# Patient Record
Sex: Female | Born: 2000 | Race: White | Hispanic: No | Marital: Single | Smoking: Never smoker
Health system: Southern US, Community
[De-identification: ages and names within clinical notes are randomized; demographics above are authoritative.]

## PROBLEM LIST (undated history)

## (undated) DIAGNOSIS — R519 Headache, unspecified: Secondary | ICD-10-CM

## (undated) DIAGNOSIS — Z516 Encounter for desensitization to allergens: Secondary | ICD-10-CM

## (undated) DIAGNOSIS — Z973 Presence of spectacles and contact lenses: Secondary | ICD-10-CM

## (undated) DIAGNOSIS — J45909 Unspecified asthma, uncomplicated: Secondary | ICD-10-CM

## (undated) DIAGNOSIS — Z9289 Personal history of other medical treatment: Secondary | ICD-10-CM

## (undated) DIAGNOSIS — H5213 Myopia, bilateral: Secondary | ICD-10-CM

## (undated) DIAGNOSIS — F909 Attention-deficit hyperactivity disorder, unspecified type: Secondary | ICD-10-CM

## (undated) DIAGNOSIS — L709 Acne, unspecified: Secondary | ICD-10-CM

## (undated) DIAGNOSIS — F419 Anxiety disorder, unspecified: Secondary | ICD-10-CM

## (undated) DIAGNOSIS — Q169 Congenital malformation of ear causing impairment of hearing, unspecified: Secondary | ICD-10-CM

## (undated) HISTORY — DX: Personal history of other medical treatment: Z92.89

## (undated) HISTORY — DX: Headache, unspecified: R51.9

## (undated) HISTORY — DX: Acne, unspecified: L70.9

## (undated) HISTORY — DX: Unspecified asthma, uncomplicated: J45.909

## (undated) HISTORY — DX: Presence of spectacles and contact lenses: Z97.3

## (undated) HISTORY — DX: Anxiety disorder, unspecified: F41.9

## (undated) HISTORY — DX: Encounter for desensitization to allergens: Z51.6

## (undated) HISTORY — DX: Myopia, bilateral: H52.13

## (undated) HISTORY — DX: Congenital malformation of ear causing impairment of hearing, unspecified: Q16.9

## (undated) HISTORY — DX: Attention-deficit hyperactivity disorder, unspecified type: F90.9

---
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
## 2021-01-11 ENCOUNTER — Other Ambulatory Visit: Payer: Self-pay

## 2021-01-11 ENCOUNTER — Ambulatory Visit (INDEPENDENT_AMBULATORY_CARE_PROVIDER_SITE_OTHER): Payer: BC Managed Care – PPO | Admitting: Otolaryngology

## 2021-01-11 DIAGNOSIS — H6121 Impacted cerumen, right ear: Secondary | ICD-10-CM

## 2021-01-11 DIAGNOSIS — J310 Chronic rhinitis: Secondary | ICD-10-CM

## 2021-01-11 NOTE — Progress Notes (Signed)
HPI: Debra Osborne is a 20 y.o. female who presents is referred by her PCP for evaluation of blockage of her ears and hearing.  She has history of allergies and gets allergy shots.  She has small ear canals bilaterally and has had wax cleaned from ears previously.  She complains of some blockage of her ears and presents here to have cerumen removed..  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 Social History   Socioeconomic History   Marital status: Single    Spouse name: Not on file   Number of children: Not on file   Years of education: Not on file   Highest education level: 10th grade  Occupational History   Occupation: Consulting civil engineer  Tobacco Use   Smoking status: Never   Smokeless tobacco: Never  Vaping Use   Vaping Use: Never used  Substance and Sexual Activity   Alcohol use: Never   Drug use: Never   Sexual activity: Not on file  Other Topics Concern   Not on file  Social History Narrative   Junior at [employer] in IP program with 7 classes currently on site 2 days weekly after otherwise over a year of online virtual Zoom school has seen grades dropped from all A's to some  F's.  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  She is only child, and mother is treated in this office for bipolar disorder considering patient high functioning likely to recover with IEP.  However they have testing starting and scheduled to be completed in mid April  by Leanna Sato, Psy.D. Patient feels though mother doubts that she needs immediate help with focus and concentration overcome zoning out being lost in overthinking to recover her academic status for this year as she returns toward onsite schooling and resumes full schedule of other activities, having anxiety and at times moodiness particularly with menses consequences for her ADHD.   Social Determinants of Health   Financial Resource Strain: Not on file  Food Insecurity: Not on file  Transportation Needs: Not on file  Physical Activity: Not on file  Stress: Not on file  Social Connections: Not on file   Family History  Problem Relation Age of Onset   Bipolar disorder Mother    Migraines Father    Bipolar disorder Maternal Grandfather    OCD Maternal Grandfather    Diabetes Paternal Grandfather    Migraines Paternal Grandfather    ADD / ADHD Maternal Uncle    OCD Paternal Uncle    Alcohol abuse Other    Diabetes Other    Heart disease Other    Stroke Other    Allergies  Allergen Reactions   Latex     Per pt report hands turn bright red and itch   Cephalosporins Hives and Rash   Prior to Admission medications   Medication Sig Start Date End Date Taking? Authorizing Provider  albuterol (VENTOLIN HFA) 108 (90 Base) MCG/ACT inhaler Inhale 1-2 puffs into the lungs every 4 (four) hours as needed. 12/22/19   [provider]  amphetamine-dextroamphetamine (ADDERALL XR) 30 MG 24 hr capsule Take 1 capsule (30 mg total) by mouth daily. 01/30/21   Cherie Ouch, PA-C  amphetamine-dextroamphetamine (ADDERALL XR) 30 MG 24 hr capsule Take 1 capsule (30 mg total) by mouth daily. 12/31/20   Cherie Ouch, PA-C  amphetamine-dextroamphetamine (ADDERALL XR) 30 MG 24 hr capsule Take 1 capsule (30 mg total) by mouth daily. 11/30/20   Melony Overly T, PA-C  budesonide-formoterol (SYMBICORT) 80-4.5 MCG/ACT inhaler 2 puffs    [provider]  clindamycin (CLINDAGEL) 1 % gel Apply 1  application topically daily. 12/23/19   [provider]  EPINEPHrine 0.3 mg/0.3 mL IJ SOAJ injection See admin instructions.    [provider]  hydrOXYzine (ATARAX/VISTARIL) 10 MG tablet Take 1-2 tablets (10-20 mg total) by mouth every 4 (four) hours as needed for anxiety. 09/15/20   Melony Overly T, PA-C  levocetirizine (XYZAL) 5 MG tablet Take 5 mg by mouth daily. 05/16/20   [provider]  norgestimate-ethinyl estradiol (ORTHO-CYCLEN) 0.25-35 MG-MCG tablet  01/11/20   [provider]  tretinoin (RETIN-A) 0.1 % cream Apply 1 application topically at bedtime. 05/18/20   [provider]     Positive ROS: Otherwise negative  All other systems have been reviewed and were otherwise negative with the exception of those mentioned in the HPI and as above.  Physical Exam: Constitutional: Alert, well-appearing, no acute distress Ears: External ears without lesions or tenderness. Ear canals are small bilaterally.  She had little bit of wax in the right ear canal that was cleaned with a curette as well as hydroperoxide and suction.  The right TM was clear.  The left ear canal had minimal cerumen that was nonobstructing and the left TM was clear.  On hearing screening with the tuning forks AC was equivocal to [facility] bilaterally but subjectively she seemed to hear well in both ears with a 1024 tuning fork. Nasal: External nose without lesions. Septum with minimal deformity and mild rhinitis.  Both middle meatus regions were clear with no signs of infection.. Oral: Lips and gums without lesions. Tongue and palate mucosa without lesions. Posterior oropharynx clear. Neck: No palpable adenopathy or masses Respiratory: Breathing comfortably  Skin: No facial/neck lesions or rash noted.  Cerumen impaction removal  Date/Time: 01/11/2021 2:33 PM Performed by: Drema Halon, MD Authorized by: Drema Halon, MD   Consent:    Consent obtained:  Verbal    Consent given by:  Patient   Risks discussed:  Pain and bleeding Procedure details:    Location:  R ear   Procedure type: curette and suction   Post-procedure details:    Inspection:  TM intact and canal normal   Hearing quality:  Improved   Procedure completion:  Tolerated well, no immediate complications Comments:     Patient with small ear canals.  She has minimal wax buildup on the right side that was cleaned with curette and suction.  The left ear canal left TM are clear.  After cleaning the wax the right TM was clear.  Assessment: Minimal wax buildup in her ears.  Right ear canal was cleaned in the office today and left ear canal did not need cleaning. I suspect allergies are contributing some to the congestion in her ears.  Plan: Recommended taking her allergy medication and use of  Nasacort or Flonase 2 sprays each nostril at night as this should help with congestion in the ears as well as congestion in the sinuses. She will follow-up as needed   Narda Bonds, MD   CC:

## 2021-02-10 ENCOUNTER — Telehealth: Payer: Self-pay | Admitting: Physician Assistant

## 2021-02-26 ENCOUNTER — Other Ambulatory Visit: Payer: Self-pay | Admitting: Physician Assistant

## 2021-02-27 ENCOUNTER — Other Ambulatory Visit: Payer: Self-pay

## 2021-02-27 MED ORDER — HYDROXYZINE HCL 10 MG PO TABS: ORAL_TABLET | ORAL | 0 refills | Status: DC

## 2021-02-27 MED ORDER — AMPHETAMINE-DEXTROAMPHET ER 30 MG PO CP24: 30.0000 mg | ORAL_CAPSULE | Freq: Every day | ORAL | 0 refills | Status: DC

## 2021-02-27 NOTE — Telephone Encounter (Signed)
Nevermind. I need to send it!

## 2021-02-27 NOTE — Telephone Encounter (Signed)
Yes, please call pharmacy and ok it. Thanks

## 2021-02-27 NOTE — Telephone Encounter (Signed)
Wing called to get refill of Adderall and Hydroxyzine to  [facility] [place], [place] - [address]  [address], [facility] [place] [address]  Phone:  [phone]  Fax:  [phone]   She has confirmed Adderall is available there.  Pharmacy closes tomorrow at 12.  She also asked if her script for the Hydroxyzine could be increased from 60 pills to 90 pills.  Next appt is 12/9

## 2021-03-16 ENCOUNTER — Encounter: Payer: Self-pay | Admitting: Physician Assistant

## 2021-03-16 ENCOUNTER — Telehealth (INDEPENDENT_AMBULATORY_CARE_PROVIDER_SITE_OTHER): Payer: BC Managed Care – PPO | Admitting: Physician Assistant

## 2021-03-16 DIAGNOSIS — F902 Attention-deficit hyperactivity disorder, combined type: Secondary | ICD-10-CM | POA: Diagnosis not present

## 2021-03-16 DIAGNOSIS — F411 Generalized anxiety disorder: Secondary | ICD-10-CM | POA: Diagnosis not present

## 2021-03-16 MED ORDER — AMPHETAMINE-DEXTROAMPHET ER 30 MG PO CP24: 30.0000 mg | ORAL_CAPSULE | Freq: Every day | ORAL | 0 refills | Status: DC

## 2021-03-16 MED ORDER — HYDROXYZINE HCL 25 MG PO TABS: 25.0000 mg | ORAL_TABLET | Freq: Three times a day (TID) | ORAL | 1 refills | Status: DC | PRN

## 2021-03-16 MED ORDER — BUSPIRONE HCL 15 MG PO TABS: ORAL_TABLET | ORAL | 1 refills | Status: DC

## 2021-03-16 NOTE — Progress Notes (Signed)
[facility]  Patient ID: Debra Osborne,  MRN: [id number]  PCP: Debra Housekeeper, MD  Date of Evaluation: 03/16/2021  time spent:30 minutes  Chief Complaint:  Chief Complaint   Anxiety; ADD; Follow-up    Virtual Visit via Telehealth  I connected with patient by a video enabled telemedicine application with their informed consent, and verified patient privacy and that I am speaking with the correct person using two identifiers.  I am private, in my office and the patient is at in her dorm.  I discussed the limitations, risks, security and privacy concerns of performing an evaluation and management service by telephone video and the availability of in person appointments. I also discussed with the patient that there may be a patient responsible charge related to this service. The patient expressed understanding and agreed to proceed.   I discussed the assessment and treatment plan with the patient. The patient was provided an opportunity to ask questions and all were answered. The patient agreed with the plan and demonstrated an understanding of the instructions.   The patient was advised to call back or seek an in-person evaluation if the symptoms worsen or if the condition fails to improve as anticipated.  I provided 30 minutes of non-face-to-face time during this encounter.    HISTORY/CURRENT STATUS: HPI For routine med check.  Very stressed.  In her first semester of college at [employer].  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  Her therapist who she sees every 2 to 4 weeks has recommended that she start an SSRI because of the anxiety.  Debra Osborne has a constant state of anxiety but on average she has a couple of panic attacks per week, sometimes no panic attacks in 1 week but sometimes as often as 4 times per week.  The hydroxyzine does help but she has to take around 40 mg for it to do anything.  She sleeps well most of the time, but  she sometimes does not have time to sleep because of her schoolwork.  States that attention is good without easy distractibility.  Able to focus on things and finish tasks to completion.   Patient denies loss of interest in usual activities and is able to enjoy things.  Denies decreased energy or motivation.  Appetite has not changed.  No extreme sadness, tearfulness, or feelings of hopelessness.  Denies suicidal or homicidal thoughts.  Denies dizziness, syncope, seizures, numbness, tingling, tremor, tics, unsteady gait, slurred speech, confusion. Denies muscle or joint pain, stiffness, or dystonia.  Individual Medical History/ Review of Systems: Changes? :No    Past medications for mental health diagnoses include: Concerta, Ritalin.   Allergies: Latex and Cephalosporins  Current Medications:  Current Outpatient Medications:    albuterol (VENTOLIN HFA) 108 (90 Base) MCG/ACT inhaler, Inhale 1-2 puffs into the lungs every 4 (four) hours as needed., Disp: , Rfl:    budesonide-formoterol (SYMBICORT) 80-4.5 MCG/ACT inhaler, 2 puffs, Disp: , Rfl:    busPIRone (BUSPAR) 15 MG tablet, 1/3 pill twice daily for 1 week, 2/3 pill twice daily for 1 week, and then 1 pill p.o. twice daily., Disp: 60 tablet, Rfl: 1   clindamycin (CLINDAGEL) 1 % gel, Apply 1 application topically daily., Disp: , Rfl:    EPINEPHrine 0.3 mg/0.3 mL IJ SOAJ injection, See admin instructions., Disp: , Rfl:    hydrOXYzine (ATARAX) 25 MG tablet, Take 1-2 tablets (25-50 mg total) by mouth every 8 (eight) hours as needed.,  Disp: 90 tablet, Rfl: 1   levocetirizine (XYZAL) 5 MG tablet, Take 5 mg by mouth daily., Disp: , Rfl:    norgestimate-ethinyl estradiol (ORTHO-CYCLEN) 0.25-35 MG-MCG tablet, , Disp: , Rfl:    tretinoin (RETIN-A) 0.1 % cream, Apply 1 application topically at bedtime., Disp: , Rfl:    [START ON 05/26/2021] amphetamine-dextroamphetamine (ADDERALL XR) 30 MG 24 hr capsule, Take 1 capsule (30 mg total) by mouth daily.,  Disp: 31 capsule, Rfl: 0   [START ON 04/27/2021] amphetamine-dextroamphetamine (ADDERALL XR) 30 MG 24 hr capsule, Take 1 capsule (30 mg total) by mouth daily., Disp: 31 capsule, Rfl: 0   [START ON 03/28/2021] amphetamine-dextroamphetamine (ADDERALL XR) 30 MG 24 hr capsule, Take 1 capsule (30 mg total) by mouth daily., Disp: 31 capsule, Rfl: 0 Medication Side Effects: none  Family Medical/ Social History: Changes? No  MENTAL HEALTH EXAM:  There were no vitals taken for this visit.There is no height or weight on file to calculate BMI.  General Appearance: Casual, Neat and Well Groomed  Eye Contact:  Good  Speech:  Clear and Coherent and Normal Rate  Volume:  Normal  Mood:  Anxious  Affect:  Anxious  Thought Process:  Goal Directed and Descriptions of Associations: Intact  Orientation:  Full (Time, Place, and Person)  Thought Content: Logical   Suicidal Thoughts:  No  Homicidal Thoughts:  No  Memory:  WNL  Judgement:  Good  Insight:  Good  Psychomotor Activity:  Normal  Concentration:  Concentration: Good and Attention Span: Good  Recall:  Good  Fund of Knowledge: Good  Language: Good  Assets:  Desire for Improvement  ADL's:  Intact  Cognition: WNL  Prognosis:  Good    DIAGNOSES:    ICD-10-CM   1. Generalized anxiety disorder  F41.1     2. Attention deficit hyperactivity disorder (ADHD), combined type, moderate  F90.2         Receiving Psychotherapy: Yes    RECOMMENDATIONS:  PDMP reviewed.  Last Adderall filled 03/29/2021. I provided 30 minutes of non-face-to-face time during this encounter, including time spent before and after the visit in records review, medical decision making, counseling pertinent to today's visit, and charting.  Discussed the anxiety.  SSRIs are good option but, with side effects including increased hunger, weight gain, emotional flattening, and sexual dysfunction.  I believe BuSpar would be a better choice at this point as she is not depressed  and BuSpar usually does not have as many side effects.  We did discuss the possibility of headache, nausea and will start at a lower dose and ramp up over a 3-week period to help prevent those side effects.  She prefers to try BuSpar over an SSRI. Also recommend changing the hydroxyzine dose since she is having to take around 40 mg for it to be effective. Increase hydroxyzine to 25 mg, 1-2 p.o. 3 times daily as needed anxiety.   Start BuSpar 15 mg 1/3 tablet twice daily for 1 week, then increase to 2/3 tablet twice daily for 1 week, then increase to 1 tablet twice daily for anxiety. Continue Adderall XR 30 mg, 1 p.o. every morning. Continue therapy. Return in 2 months.  Melony Overly, PA-C

## 2021-04-10 DIAGNOSIS — J301 Allergic rhinitis due to pollen: Secondary | ICD-10-CM | POA: Diagnosis not present

## 2021-04-10 DIAGNOSIS — J3081 Allergic rhinitis due to animal (cat) (dog) hair and dander: Secondary | ICD-10-CM | POA: Diagnosis not present

## 2021-04-10 DIAGNOSIS — J3089 Other allergic rhinitis: Secondary | ICD-10-CM | POA: Diagnosis not present

## 2021-04-17 DIAGNOSIS — Z62820 Parent-biological child conflict: Secondary | ICD-10-CM | POA: Diagnosis not present

## 2021-04-17 DIAGNOSIS — F411 Generalized anxiety disorder: Secondary | ICD-10-CM | POA: Diagnosis not present

## 2021-04-17 DIAGNOSIS — F908 Attention-deficit hyperactivity disorder, other type: Secondary | ICD-10-CM | POA: Diagnosis not present

## 2021-04-21 DIAGNOSIS — J069 Acute upper respiratory infection, unspecified: Secondary | ICD-10-CM | POA: Diagnosis not present

## 2021-04-30 ENCOUNTER — Encounter: Payer: Self-pay | Admitting: Physician Assistant

## 2021-04-30 ENCOUNTER — Telehealth (INDEPENDENT_AMBULATORY_CARE_PROVIDER_SITE_OTHER): Payer: BC Managed Care – PPO | Admitting: Physician Assistant

## 2021-04-30 DIAGNOSIS — F411 Generalized anxiety disorder: Secondary | ICD-10-CM

## 2021-04-30 DIAGNOSIS — G4700 Insomnia, unspecified: Secondary | ICD-10-CM

## 2021-04-30 DIAGNOSIS — F418 Other specified anxiety disorders: Secondary | ICD-10-CM

## 2021-04-30 DIAGNOSIS — F902 Attention-deficit hyperactivity disorder, combined type: Secondary | ICD-10-CM

## 2021-04-30 MED ORDER — BUSPIRONE HCL 15 MG PO TABS: 15.0000 mg | ORAL_TABLET | Freq: Three times a day (TID) | ORAL | 1 refills | Status: DC

## 2021-04-30 MED ORDER — HYDROXYZINE HCL 25 MG PO TABS: 25.0000 mg | ORAL_TABLET | Freq: Three times a day (TID) | ORAL | 5 refills | Status: DC | PRN

## 2021-04-30 NOTE — Progress Notes (Signed)
[facility]  Patient ID: Debra Osborne,  MRN: [id number]  PCP: Georgann Housekeeper, MD  Date of Evaluation: 04/30/2021  time spent:20 minutes  Chief Complaint:  Chief Complaint   Anxiety; ADHD; Follow-up    Virtual Visit via Telehealth  I connected with patient by a video enabled telemedicine application with their informed consent, and verified patient privacy and that I am speaking with the correct person using two identifiers.  I am private, in my office and the patient is at in her dorm.  I discussed the limitations, risks, security and privacy concerns of performing an evaluation and management service by telephone video and the availability of in person appointments. I also discussed with the patient that there may be a patient responsible charge related to this service. The patient expressed understanding and agreed to proceed.   I discussed the assessment and treatment plan with the patient. The patient was provided an opportunity to ask questions and all were answered. The patient agreed with the plan and demonstrated an understanding of the instructions.   The patient was advised to call back or seek an in-person evaluation if the symptoms worsen or if the condition fails to improve as anticipated.  I provided 20 minutes of non-face-to-face time during this encounter.    HISTORY/CURRENT STATUS: HPI For routine med check.  Anxiety is much better since we increased the hydroxyzine and added BuSpar.  The hydroxyzine also helps her sleep when nothing else has.  She is tolerating the BuSpar just fine but does report dizziness that happens pretty much right away after taking it (takes on an empty stomach) and usually goes away in about 15 minutes.  She states the benefits outweigh that side effect though and she does not want to change it.  She also notices it wearing off by the time she is able to take her second dose during the day and if she does not take it right away  then she will have a panic attack.  The generalized worry, ruminating thoughts, obsessions are not there anymore though.  Adderall is still working well.  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.  Grades are good.  States that attention is good without easy distractibility.  Able to focus on things and finish tasks to completion.   Patient denies loss of interest in usual activities and is able to enjoy things.  Denies decreased energy or motivation.  Appetite has not changed.  No extreme sadness, tearfulness, or feelings of hopelessness.  Denies suicidal or homicidal thoughts.  Denies dizziness, syncope, seizures, numbness, tingling, tremor, tics, unsteady gait, slurred speech, confusion. Denies muscle or joint pain, stiffness, or dystonia.  Individual Medical History/ Review of Systems: Changes? :No    Past medications for mental health diagnoses include: Concerta, Ritalin.   Allergies: Latex and Cephalosporins  Current Medications:  Current Outpatient Medications:    albuterol (VENTOLIN HFA) 108 (90 Base) MCG/ACT inhaler, Inhale 1-2 puffs into the lungs every 4 (four) hours as needed., Disp: , Rfl:    [START ON 05/26/2021] amphetamine-dextroamphetamine (ADDERALL XR) 30 MG 24 hr capsule, Take 1 capsule (30 mg total) by mouth daily., Disp: 31 capsule, Rfl: 0   amphetamine-dextroamphetamine (ADDERALL XR) 30 MG 24 hr capsule, Take 1 capsule (30 mg total) by mouth daily., Disp: 31 capsule, Rfl: 0   amphetamine-dextroamphetamine (ADDERALL XR) 30 MG 24 hr capsule, Take 1 capsule (30 mg total) by mouth daily., Disp: 31 capsule, Rfl: 0   budesonide-formoterol (  SYMBICORT) 80-4.5 MCG/ACT inhaler, 2 puffs, Disp: , Rfl:    clindamycin (CLINDAGEL) 1 % gel, Apply 1 application topically daily., Disp: , Rfl:    EPINEPHrine 0.3 mg/0.3 mL IJ SOAJ injection, See admin instructions., Disp: , Rfl:    levocetirizine (XYZAL) 5 MG tablet, Take 5 mg by mouth daily., Disp: , Rfl:     norgestimate-ethinyl estradiol (ORTHO-CYCLEN) 0.25-35 MG-MCG tablet, , Disp: , Rfl:    tretinoin (RETIN-A) 0.1 % cream, Apply 1 application topically at bedtime., Disp: , Rfl:    busPIRone (BUSPAR) 15 MG tablet, Take 1 tablet (15 mg total) by mouth 3 (three) times daily., Disp: 90 tablet, Rfl: 1   hydrOXYzine (ATARAX) 25 MG tablet, Take 1-2 tablets (25-50 mg total) by mouth every 8 (eight) hours as needed., Disp: 90 tablet, Rfl: 5 Medication Side Effects: dizziness/lightheadedness  Family Medical/ Social History: Changes? No  MENTAL HEALTH EXAM:  There were no vitals taken for this visit.There is no height or weight on file to calculate BMI.  General Appearance: Casual, Neat and Well Groomed  Eye Contact:  Good  Speech:  Clear and Coherent and Normal Rate  Volume:  Normal  Mood:  Euthymic  Affect:  Congruent  Thought Process:  Goal Directed and Descriptions of Associations: Intact  Orientation:  Full (Time, Place, and Person)  Thought Content: Logical   Suicidal Thoughts:  No  Homicidal Thoughts:  No  Memory:  WNL  Judgement:  Good  Insight:  Good  Psychomotor Activity:  Normal  Concentration:  Concentration: Good and Attention Span: Good  Recall:  Good  Fund of Knowledge: Good  Language: Good  Assets:  Desire for Improvement  ADL's:  Intact  Cognition: WNL  Prognosis:  Good    DIAGNOSES:    ICD-10-CM   1. Generalized anxiety disorder  F41.1     2. Attention deficit hyperactivity disorder (ADHD), combined type, moderate  F90.2     3. Situational anxiety  F41.8     4. Insomnia, unspecified type  G47.00          Receiving Psychotherapy: Yes    RECOMMENDATIONS:  PDMP reviewed.  Last Adderall filled 03/31/2021. I provided 20 minutes of non-face-to-face time during this encounter, including time spent before and after the visit in records review, medical decision making, counseling pertinent to today's visit, and charting.  We discussed the dizziness.  Recommend  she take the BuSpar with food, even if it is just a peanut butter cracker.  That may help prevent the dizziness. Since it seems that the BuSpar is wearing off by taking it twice daily, I recommend increasing to 3 times daily.  She will set an alarm so she will not forget the midday dose.  On the prescription the directions will be to take 1 p.o. 3 times daily, but I do recommend that she take only 5 mg or 10 mg midday to begin with, if that dose is effective then do not increase it.  If not increased to 15 mg midday as well.  Again, with the dizziness, she might not have that side effect at a lower dose.  She understands. Continue hydroxyzine 25 mg, 1-2 p.o. 3 times daily as needed anxiety or sleep. Increase BuSpar 15 mg to 1 p.o. 3 times daily.   Continue Adderall XR 30 mg, 1 p.o. every morning. Continue therapy. Return in 6 weeks.  Melony Overly, PA-C

## 2021-05-01 DIAGNOSIS — F908 Attention-deficit hyperactivity disorder, other type: Secondary | ICD-10-CM | POA: Diagnosis not present

## 2021-05-01 DIAGNOSIS — Z62820 Parent-biological child conflict: Secondary | ICD-10-CM | POA: Diagnosis not present

## 2021-05-01 DIAGNOSIS — F411 Generalized anxiety disorder: Secondary | ICD-10-CM | POA: Diagnosis not present

## 2021-05-03 DIAGNOSIS — Z516 Encounter for desensitization to allergens: Secondary | ICD-10-CM | POA: Diagnosis not present

## 2021-05-10 DIAGNOSIS — Z516 Encounter for desensitization to allergens: Secondary | ICD-10-CM | POA: Diagnosis not present

## 2021-05-15 DIAGNOSIS — F908 Attention-deficit hyperactivity disorder, other type: Secondary | ICD-10-CM | POA: Diagnosis not present

## 2021-05-15 DIAGNOSIS — F411 Generalized anxiety disorder: Secondary | ICD-10-CM | POA: Diagnosis not present

## 2021-05-15 DIAGNOSIS — Z62820 Parent-biological child conflict: Secondary | ICD-10-CM | POA: Diagnosis not present

## 2021-05-16 DIAGNOSIS — Z516 Encounter for desensitization to allergens: Secondary | ICD-10-CM | POA: Diagnosis not present

## 2021-05-23 DIAGNOSIS — Z516 Encounter for desensitization to allergens: Secondary | ICD-10-CM | POA: Diagnosis not present

## 2021-05-28 DIAGNOSIS — F908 Attention-deficit hyperactivity disorder, other type: Secondary | ICD-10-CM | POA: Diagnosis not present

## 2021-05-28 DIAGNOSIS — F410 Panic disorder [episodic paroxysmal anxiety] without agoraphobia: Secondary | ICD-10-CM | POA: Diagnosis not present

## 2021-05-28 DIAGNOSIS — F439 Reaction to severe stress, unspecified: Secondary | ICD-10-CM | POA: Diagnosis not present

## 2021-05-31 DIAGNOSIS — Z516 Encounter for desensitization to allergens: Secondary | ICD-10-CM | POA: Diagnosis not present

## 2021-06-07 DIAGNOSIS — Z516 Encounter for desensitization to allergens: Secondary | ICD-10-CM | POA: Diagnosis not present

## 2021-06-11 ENCOUNTER — Encounter: Payer: Self-pay | Admitting: Physician Assistant

## 2021-06-11 ENCOUNTER — Telehealth (INDEPENDENT_AMBULATORY_CARE_PROVIDER_SITE_OTHER): Payer: BC Managed Care – PPO | Admitting: Physician Assistant

## 2021-06-11 DIAGNOSIS — F411 Generalized anxiety disorder: Secondary | ICD-10-CM

## 2021-06-11 DIAGNOSIS — G4700 Insomnia, unspecified: Secondary | ICD-10-CM | POA: Diagnosis not present

## 2021-06-11 DIAGNOSIS — F902 Attention-deficit hyperactivity disorder, combined type: Secondary | ICD-10-CM | POA: Diagnosis not present

## 2021-06-11 MED ORDER — AMPHETAMINE-DEXTROAMPHET ER 30 MG PO CP24: 30.0000 mg | ORAL_CAPSULE | Freq: Every day | ORAL | 0 refills | Status: DC

## 2021-06-11 MED ORDER — BUSPIRONE HCL 15 MG PO TABS: 15.0000 mg | ORAL_TABLET | Freq: Three times a day (TID) | ORAL | 5 refills | Status: DC

## 2021-06-11 MED ORDER — AMPHETAMINE-DEXTROAMPHETAMINE 20 MG PO TABS: 20.0000 mg | ORAL_TABLET | Freq: Every day | ORAL | 0 refills | Status: DC

## 2021-06-11 NOTE — Progress Notes (Signed)
[facility] ? ?Patient ID: Debra Osborne,  ?MRN: [id number] ? ?PCP: Wenda Low, MD ? ?Date of Evaluation: 06/11/2021  ?time spent:20 minutes ? ?Chief Complaint:  ?Chief Complaint   ?Anxiety ?  ? ? ?Virtual Visit via Telehealth ? ?I connected with patient by a video enabled telemedicine application with their informed consent, and verified patient privacy and that I am speaking with the correct person using two identifiers.  I am private, in my office and the patient is in her dorm. ? ?I discussed the limitations, risks, security and privacy concerns of performing an evaluation and management service by video and the availability of in person appointments. I also discussed with the patient that there may be a patient responsible charge related to this service. The patient expressed understanding and agreed to proceed. ?  ?I discussed the assessment and treatment plan with the patient. The patient was provided an opportunity to ask questions and all were answered. The patient agreed with the plan and demonstrated an understanding of the instructions. ?  ?The patient was advised to call back or seek an in-person evaluation if the symptoms worsen or if the condition fails to improve as anticipated. ? ?I provided 20 minutes of non-face-to-face time during this encounter. ? ? ? ?HISTORY/CURRENT STATUS: ?HPI For routine med check. ? ?Debra Osborne is doing very well.  We increased the BuSpar about 6 weeks ago and she has responded well to it.  She does not need the entire pill in the middle of the day dose so is only taking 7.5 mg at that point.  She is not having dizziness like she did before, it does still occur after the evening dose but states it is manageable and not bothersome.  The benefits outweigh that.  Not having panic attacks, still gets a little anxious in certain situations but overall is much much better. ? ?She is able to focus and get things done in a timely manner, at least most of the time.  In  the past she had taken an Adderall 20 mg pill in the afternoon just as needed and asks if that is a possibility to add back in.  She has not had any trouble from the [employer] of Adderall.  She has been getting it through her student health pharmacy. ? ?Patient denies loss of interest in usual activities and is able to enjoy things.  Denies decreased energy or motivation.  Appetite has not changed.  No calorie restricting, binging or purging, or laxative use.  No self-harm.  Personal hygiene is normal.  No extreme sadness, tearfulness, or feelings of hopelessness.  Sleeping better, the hydroxyzine helps. Denies suicidal or homicidal thoughts. ? ?Denies dizziness, syncope, seizures, numbness, tingling, tremor, tics, unsteady gait, slurred speech, confusion. Denies muscle or joint pain, stiffness, or dystonia. ? ?Individual Medical History/ Review of Systems: Changes? :No   ? ?Past medications for mental health diagnoses include: ?Concerta, Ritalin.  ? ?Allergies: Latex and Cephalosporins ? ?Current Medications:  ?Current Outpatient Medications:  ?  albuterol (VENTOLIN HFA) 108 (90 Base) MCG/ACT inhaler, Inhale 1-2 puffs into the lungs every 4 (four) hours as needed., Disp: , Rfl:  ?  amphetamine-dextroamphetamine (ADDERALL) 20 MG tablet, Take 1 tablet (20 mg total) by mouth daily in the afternoon. prn, Disp: 30 tablet, Rfl: 0 ?  budesonide-formoterol (SYMBICORT) 80-4.5 MCG/ACT inhaler, 2 puffs, Disp: , Rfl:  ?  clindamycin (CLINDAGEL) 1 % gel, Apply 1 application topically daily., Disp: , Rfl:  ?  EPINEPHrine 0.3  mg/0.3 mL IJ SOAJ injection, See admin instructions., Disp: , Rfl:  ?  hydrOXYzine (ATARAX) 25 MG tablet, Take 1-2 tablets (25-50 mg total) by mouth every 8 (eight) hours as needed., Disp: 90 tablet, Rfl: 5 ?  levocetirizine (XYZAL) 5 MG tablet, Take 5 mg by mouth daily., Disp: , Rfl:  ?  norgestimate-ethinyl estradiol (ORTHO-CYCLEN) 0.25-35 MG-MCG tablet, , Disp: , Rfl:  ?  tretinoin (RETIN-A) 0.1 %  cream, Apply 1 application topically at bedtime., Disp: , Rfl:  ?  [START ON 08/25/2021] amphetamine-dextroamphetamine (ADDERALL XR) 30 MG 24 hr capsule, Take 1 capsule (30 mg total) by mouth daily., Disp: 31 capsule, Rfl: 0 ?  [START ON 07/27/2021] amphetamine-dextroamphetamine (ADDERALL XR) 30 MG 24 hr capsule, Take 1 capsule (30 mg total) by mouth daily., Disp: 31 capsule, Rfl: 0 ?  [START ON 06/27/2021] amphetamine-dextroamphetamine (ADDERALL XR) 30 MG 24 hr capsule, Take 1 capsule (30 mg total) by mouth daily., Disp: 31 capsule, Rfl: 0 ?  busPIRone (BUSPAR) 15 MG tablet, Take 1 tablet (15 mg total) by mouth 3 (three) times daily., Disp: 90 tablet, Rfl: 5 ?Medication Side Effects: dizziness/lightheadedness ? ?Family Medical/ Social History: Changes? No ? ?MENTAL HEALTH EXAM: ? ?There were no vitals taken for this visit.There is no height or weight on file to calculate BMI.  ?General Appearance: Casual, Neat and Well Groomed  ?Eye Contact:  Good  ?Speech:  Clear and Coherent and Normal Rate  ?Volume:  Normal  ?Mood:  Euthymic  ?Affect:  Congruent  ?Thought Process:  Goal Directed and Descriptions of Associations: Circumstantial  ?Orientation:  Full (Time, Place, and Person)  ?Thought Content: Logical   ?Suicidal Thoughts:  No  ?Homicidal Thoughts:  No  ?Memory:  WNL  ?Judgement:  Good  ?Insight:  Good  ?Psychomotor Activity:  Normal  ?Concentration:  Concentration: Good and Attention Span: Good  ?Recall:  Good  ?Fund of Knowledge: Good  ?Language: Good  ?Assets:  Desire for Improvement  ?ADL's:  Intact  ?Cognition: WNL  ?Prognosis:  Good  ? ? ?DIAGNOSES:  ?  ICD-10-CM   ?1. Generalized anxiety disorder  F41.1   ?  ?2. Insomnia, unspecified type  G47.00   ?  ?3. Attention deficit hyperactivity disorder (ADHD), combined type, moderate  F90.2   ?  ? ? ?Receiving Psychotherapy: Yes  ? ? ?RECOMMENDATIONS:  ?PDMP reviewed.  Last Adderall filled 05/31/2021.   ?I provided 20 minutes of non-face-to-face time during this  encounter, including time spent before and after the visit in records review, medical decision making, counseling pertinent to today's visit, and charting.  ?I am glad to see her doing well!  No changes in meds are necessary except I will add in the Adderall IR 20 mg in the afternoon as needed. ? ?Continue hydroxyzine 25 mg, 1-2 p.o. 3 times daily as needed anxiety or sleep. ?Continue BuSpar 15 mg, 1 p.o. 3 times daily.  (She is usually only taking 1/2 pill mid day and that is effective for now.) ?Continue Adderall XR 30 mg, 1 p.o. every morning. ?Restart Adderall IR 20 mg, 1 p.o. q. afternoon as needed. ?Continue therapy. ?Return in 3 months. ? ?Donnal Moat, PA-C  ?

## 2021-06-12 DIAGNOSIS — F908 Attention-deficit hyperactivity disorder, other type: Secondary | ICD-10-CM | POA: Diagnosis not present

## 2021-06-12 DIAGNOSIS — F410 Panic disorder [episodic paroxysmal anxiety] without agoraphobia: Secondary | ICD-10-CM | POA: Diagnosis not present

## 2021-06-12 DIAGNOSIS — F439 Reaction to severe stress, unspecified: Secondary | ICD-10-CM | POA: Diagnosis not present

## 2021-06-12 DIAGNOSIS — Z516 Encounter for desensitization to allergens: Secondary | ICD-10-CM | POA: Diagnosis not present

## 2021-06-18 DIAGNOSIS — F908 Attention-deficit hyperactivity disorder, other type: Secondary | ICD-10-CM | POA: Diagnosis not present

## 2021-06-18 DIAGNOSIS — F410 Panic disorder [episodic paroxysmal anxiety] without agoraphobia: Secondary | ICD-10-CM | POA: Diagnosis not present

## 2021-06-18 DIAGNOSIS — F439 Reaction to severe stress, unspecified: Secondary | ICD-10-CM | POA: Diagnosis not present

## 2021-06-20 DIAGNOSIS — Z516 Encounter for desensitization to allergens: Secondary | ICD-10-CM | POA: Diagnosis not present

## 2021-06-25 ENCOUNTER — Telehealth: Payer: Self-pay | Admitting: Physician Assistant

## 2021-06-25 DIAGNOSIS — F439 Reaction to severe stress, unspecified: Secondary | ICD-10-CM | POA: Diagnosis not present

## 2021-06-25 DIAGNOSIS — F410 Panic disorder [episodic paroxysmal anxiety] without agoraphobia: Secondary | ICD-10-CM | POA: Diagnosis not present

## 2021-06-25 DIAGNOSIS — F908 Attention-deficit hyperactivity disorder, other type: Secondary | ICD-10-CM | POA: Diagnosis not present

## 2021-06-25 NOTE — Telephone Encounter (Signed)
Pt would like to know if [name] will prescribe something for her PTSD, Insomnia and nightmares before her appt on 3/27. ? ? ?
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
## 2021-06-25 NOTE — Telephone Encounter (Signed)
LVM for patient to [facility]. Last visit Adderall 20 mg IR was added for afternoon, ? Sleep problems.  ?

## 2021-06-25 NOTE — Telephone Encounter (Signed)
LVM to [facility] 

## 2021-06-25 NOTE — Telephone Encounter (Signed)
We've never talked about PTSD that I can tell, for now, have her take Hydroxyzine 25 mg, 2 qhs prn sleep and we'll talk about other options at her next visit. Thanks.

## 2021-06-26 NOTE — Telephone Encounter (Signed)
Called patient and recommendations were given.  ?

## 2021-06-28 DIAGNOSIS — Z516 Encounter for desensitization to allergens: Secondary | ICD-10-CM | POA: Diagnosis not present

## 2021-07-02 ENCOUNTER — Telehealth (INDEPENDENT_AMBULATORY_CARE_PROVIDER_SITE_OTHER): Payer: Self-pay | Admitting: Physician Assistant

## 2021-07-02 ENCOUNTER — Encounter: Payer: Self-pay | Admitting: Physician Assistant

## 2021-07-02 DIAGNOSIS — Z91199 Patient's noncompliance with other medical treatment and regimen due to unspecified reason: Secondary | ICD-10-CM

## 2021-07-02 NOTE — Progress Notes (Signed)
Patient was scheduled for telehealth visit.  I was unable to connect through [facility]ility, so called the mobile number listed on 3 separate times, leaving a message twice on an identified voicemail.  The last time someone answered but said I had a wrong number.  Debra Osborne did not call the office at any time to have the appt.   ?I have asked admin staff to reschedule her. ?

## 2021-07-05 ENCOUNTER — Encounter: Payer: Self-pay | Admitting: Physician Assistant

## 2021-07-05 ENCOUNTER — Telehealth (INDEPENDENT_AMBULATORY_CARE_PROVIDER_SITE_OTHER): Payer: BC Managed Care – PPO | Admitting: Physician Assistant

## 2021-07-05 DIAGNOSIS — F902 Attention-deficit hyperactivity disorder, combined type: Secondary | ICD-10-CM | POA: Diagnosis not present

## 2021-07-05 DIAGNOSIS — F411 Generalized anxiety disorder: Secondary | ICD-10-CM | POA: Diagnosis not present

## 2021-07-05 DIAGNOSIS — F4310 Post-traumatic stress disorder, unspecified: Secondary | ICD-10-CM

## 2021-07-05 DIAGNOSIS — G4700 Insomnia, unspecified: Secondary | ICD-10-CM | POA: Diagnosis not present

## 2021-07-05 MED ORDER — QUETIAPINE FUMARATE 25 MG PO TABS
25.0000 mg | ORAL_TABLET | Freq: Every day | ORAL | 1 refills | Status: DC
Start: 2021-07-05 — End: 2021-08-20
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
## 2021-07-10 DIAGNOSIS — Z516 Encounter for desensitization to allergens: Secondary | ICD-10-CM | POA: Diagnosis not present

## 2021-07-16 DIAGNOSIS — F439 Reaction to severe stress, unspecified: Secondary | ICD-10-CM | POA: Diagnosis not present

## 2021-07-16 DIAGNOSIS — F908 Attention-deficit hyperactivity disorder, other type: Secondary | ICD-10-CM | POA: Diagnosis not present

## 2021-07-16 DIAGNOSIS — F410 Panic disorder [episodic paroxysmal anxiety] without agoraphobia: Secondary | ICD-10-CM | POA: Diagnosis not present

## 2021-07-17 DIAGNOSIS — Z516 Encounter for desensitization to allergens: Secondary | ICD-10-CM | POA: Diagnosis not present

## 2021-07-26 DIAGNOSIS — Z516 Encounter for desensitization to allergens: Secondary | ICD-10-CM | POA: Diagnosis not present

## 2021-07-26 DIAGNOSIS — J301 Allergic rhinitis due to pollen: Secondary | ICD-10-CM | POA: Diagnosis not present

## 2021-07-26 DIAGNOSIS — J3081 Allergic rhinitis due to animal (cat) (dog) hair and dander: Secondary | ICD-10-CM | POA: Diagnosis not present

## 2021-07-27 DIAGNOSIS — J3089 Other allergic rhinitis: Secondary | ICD-10-CM | POA: Diagnosis not present

## 2021-07-30 DIAGNOSIS — F410 Panic disorder [episodic paroxysmal anxiety] without agoraphobia: Secondary | ICD-10-CM | POA: Diagnosis not present

## 2021-07-30 DIAGNOSIS — F439 Reaction to severe stress, unspecified: Secondary | ICD-10-CM | POA: Diagnosis not present

## 2021-07-30 DIAGNOSIS — F908 Attention-deficit hyperactivity disorder, other type: Secondary | ICD-10-CM | POA: Diagnosis not present

## 2021-08-07 DIAGNOSIS — Z516 Encounter for desensitization to allergens: Secondary | ICD-10-CM | POA: Diagnosis not present

## 2021-08-10 ENCOUNTER — Telehealth: Payer: BC Managed Care – PPO | Admitting: Physician Assistant

## 2021-08-13 DIAGNOSIS — F908 Attention-deficit hyperactivity disorder, other type: Secondary | ICD-10-CM | POA: Diagnosis not present

## 2021-08-13 DIAGNOSIS — F439 Reaction to severe stress, unspecified: Secondary | ICD-10-CM | POA: Diagnosis not present

## 2021-08-13 DIAGNOSIS — F410 Panic disorder [episodic paroxysmal anxiety] without agoraphobia: Secondary | ICD-10-CM | POA: Diagnosis not present

## 2021-08-20 ENCOUNTER — Encounter: Payer: Self-pay | Admitting: Physician Assistant

## 2021-08-20 ENCOUNTER — Telehealth (INDEPENDENT_AMBULATORY_CARE_PROVIDER_SITE_OTHER): Payer: BC Managed Care – PPO | Admitting: Physician Assistant

## 2021-08-20 DIAGNOSIS — F4310 Post-traumatic stress disorder, unspecified: Secondary | ICD-10-CM

## 2021-08-20 DIAGNOSIS — F418 Other specified anxiety disorders: Secondary | ICD-10-CM | POA: Diagnosis not present

## 2021-08-20 DIAGNOSIS — F514 Sleep terrors [night terrors]: Secondary | ICD-10-CM

## 2021-08-20 DIAGNOSIS — F411 Generalized anxiety disorder: Secondary | ICD-10-CM

## 2021-08-20 DIAGNOSIS — G4700 Insomnia, unspecified: Secondary | ICD-10-CM

## 2021-08-20 DIAGNOSIS — F902 Attention-deficit hyperactivity disorder, combined type: Secondary | ICD-10-CM

## 2021-08-20 MED ORDER — QUETIAPINE FUMARATE 200 MG PO TABS: ORAL_TABLET | ORAL | 1 refills | Status: DC

## 2021-08-20 NOTE — Progress Notes (Signed)
[facility] ? ?Patient ID: Debra Osborne,  ?MRN: [id number] ? ?PCP: Georgann Housekeeper, MD ? ?Date of Evaluation: 08/20/2021  ?time spent:30 minutes ? ?Chief Complaint:  ?Chief Complaint   ?Anxiety; Depression; Insomnia; Follow-up ?  ? ? ?Virtual Visit via Telehealth ? ?I connected with patient by a video enabled telemedicine application with their informed consent, and verified patient privacy and that I am speaking with the correct person using two identifiers.  I am private, in my office and the patient is in her dorm. ? ?I discussed the limitations, risks, security and privacy concerns of performing an evaluation and management service by video and the availability of in person appointments. I also discussed with the patient that there may be a patient responsible charge related to this service. The patient expressed understanding and agreed to proceed. ?  ?I discussed the assessment and treatment plan with the patient. The patient was provided an opportunity to ask questions and all were answered. The patient agreed with the plan and demonstrated an understanding of the instructions. ?  ?The patient was advised to call back or seek an in-person evaluation if the symptoms worsen or if the condition fails to improve as anticipated. ? ?I provided 30 minutes of non-face-to-face time during this encounter. ? ?HISTORY/CURRENT STATUS: ?HPI For routine med check. ? ?Added Seroquel 6 weeks ago for PTSD/nightmares, taking 75 mg every night and has to be very specific about the time that she takes it and must take it with the hydroxyzine in order to go to sleep and not have nightmares.  It is helpful but she would like for it to help even more.  She is not too drowsy, is able to get up in the mornings without any problems. ? 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  She and her grandmother will be going to [place] for 5 or 6 weeks this summer and she is looking forward to that.  She is living with her dad now that school is out.  That is going well. ? ?She is able to enjoy things.  Energy and motivation are good.  She does not cry easily.  No isolating.  ADLs and personal hygiene are normal.  Appetite is good and weight is stable.  No laxative use, binging or purging, or calorie restricting.  No self-harm.  No suicidal or homicidal thoughts. ? ?Patient denies increased energy with decreased need for sleep, no increased talkativeness, no racing thoughts, no impulsivity or risky behaviors, no increased spending, no increased libido, no grandiosity, no increased irritability or anger, and no hallucinations. ? ?Denies dizziness, syncope, seizures, numbness, tingling, tremor, tics, unsteady gait, slurred speech, confusion. Denies muscle or joint pain, stiffness, or dystonia. Denies unexplained weight loss, frequent infections, or sores that heal slowly.  No polyphagia, polydipsia, or polyuria. Denies visual changes or paresthesias.  ? ?Individual Medical History/ Review of Systems: Changes? :No   ? ?Past medications for mental health diagnoses include: ?Concerta, Ritalin.  ? ?Allergies: Latex and Cephalosporins ? ?Current Medications:  ?Current Outpatient Medications:  ?  albuterol (VENTOLIN HFA) 108 (90 Base) MCG/ACT inhaler, Inhale 1-2 puffs into the lungs every 4 (four) hours as needed., Disp: , Rfl:  ?  [START ON 08/25/2021] amphetamine-dextroamphetamine (ADDERALL XR) 30 MG 24 hr capsule, Take 1 capsule (30 mg total)  by mouth daily., Disp: 31 capsule, Rfl: 0 ?  amphetamine-dextroamphetamine (ADDERALL XR) 30 MG 24 hr capsule, Take 1 capsule (30 mg total) by mouth daily., Disp: 31 capsule, Rfl: 0 ?  amphetamine-dextroamphetamine (ADDERALL XR) 30 MG 24 hr capsule, Take 1 capsule (30 mg total) by mouth daily., Disp: 31 capsule, Rfl: 0 ?   amphetamine-dextroamphetamine (ADDERALL) 20 MG tablet, Take 1 tablet (20 mg total) by mouth daily in the afternoon. prn, Disp: 30 tablet, Rfl: 0 ?  budesonide-formoterol (SYMBICORT) 80-4.5 MCG/ACT inhaler, 2 puffs, Disp: , Rfl:  ?  busPIRone (BUSPAR) 15 MG tablet, Take 1 tablet (15 mg total) by mouth 3 (three) times daily., Disp: 90 tablet, Rfl: 5 ?  clindamycin (CLINDAGEL) 1 % gel, Apply 1 application topically daily., Disp: , Rfl:  ?  EPINEPHrine 0.3 mg/0.3 mL IJ SOAJ injection, See admin instructions., Disp: , Rfl:  ?  hydrOXYzine (ATARAX) 25 MG tablet, Take 1-2 tablets (25-50 mg total) by mouth every 8 (eight) hours as needed., Disp: 90 tablet, Rfl: 5 ?  levocetirizine (XYZAL) 5 MG tablet, Take 5 mg by mouth daily., Disp: , Rfl:  ?  norgestimate-ethinyl estradiol (ORTHO-CYCLEN) 0.25-35 MG-MCG tablet, , Disp: , Rfl:  ?  QUEtiapine (SEROQUEL) 200 MG tablet, 1/2 pill qhs for 1 wk, then may increase to 1 pill qhs., Disp: 30 tablet, Rfl: 1 ?  tretinoin (RETIN-A) 0.1 % cream, Apply 1 application topically at bedtime., Disp: , Rfl:  ?  UNABLE TO FIND, NaCl 500 mg daily, Disp: , Rfl:  ?Medication Side Effects: dizziness/lightheadedness ? ?Family Medical/ Social History: Changes? No ? ?MENTAL HEALTH EXAM: ? ?There were no vitals taken for this visit.There is no height or weight on file to calculate BMI.  ?General Appearance: Casual, Neat and Well Groomed  ?Eye Contact:  Good  ?Speech:  Clear and Coherent and Normal Rate  ?Volume:  Normal  ?Mood:  Euthymic  ?Affect:  Congruent  ?Thought Process:  Goal Directed and Descriptions of Associations: Circumstantial  ?Orientation:  Full (Time, Place, and Person)  ?Thought Content: Logical   ?Suicidal Thoughts:  No  ?Homicidal Thoughts:  No  ?Memory:  WNL  ?Judgement:  Good  ?Insight:  Good  ?Psychomotor Activity:  Normal  ?Concentration:  Concentration: Good and Attention Span: Good  ?Recall:  Good  ?Fund of Knowledge: Good  ?Language: Good  ?Assets:  Desire for  Improvement ?Financial Resources/Insurance ?Housing ?Transportation  ?ADL's:  Intact  ?Cognition: WNL  ?Prognosis:  Good  ? ? ?DIAGNOSES:  ?  ICD-10-CM   ?1. PTSD (post-traumatic stress disorder)  F43.10   ?  ?2. Attention deficit hyperactivity disorder (ADHD), combined type, moderate  F90.2   ?  ?3. Generalized anxiety disorder  F41.1   ?  ?4. Situational anxiety  F41.8   ?  ?5. Insomnia, unspecified type  G47.00   ?  ?6. Night terrors  F51.4   ?  ? ?Receiving Psychotherapy: Yes    in both DBT and talk therapy ? ? ?RECOMMENDATIONS:  ?PDMP reviewed.  Last Adderall XR filled 08/01/2021.  Last IR was filled 06/11/2021. ?I provided 30 minutes of non-face-to-face time during this encounter, including time spent before and after the visit in records review, medical decision making, counseling pertinent to today's visit, and charting.  ?We discussed her symptoms.  She has responded to the Seroquel somewhat but not enough.  I recommend increasing the dose.  She would like to do that.  When I know for sure she is going to stay  on this I will order lipids, BMP, and hemoglobin A1c. ? ?Continue Adderall XR 30 mg, 1 p.o. every morning. ?Continue Adderall IR 20 mg, 1 p.o. q. afternoon as needed. ?Continue hydroxyzine 25 mg, 1-2 p.o. 3 times daily as needed anxiety or sleep. ?Continue BuSpar 15 mg, 1 p.o. 3 times daily.  (She is usually only taking 1/2 pill mid day and that is effective for now.) ?Increase Seroquel to 200 mg, 1/2 pill p.o. nightly for 1 week and then may increase to 1 pill nightly. ?Continue therapy. ?Return in 4 weeks. ? ?Melony Overly, PA-C  ?

## 2021-08-23 ENCOUNTER — Telehealth: Payer: Self-pay | Admitting: Physician Assistant

## 2021-08-23 NOTE — Telephone Encounter (Signed)
Pt called reporting increase in Seroquel dose is causing tightness in chest. Not helping with nightmares. Contact pt @ [phone] follow up apt 6/6. [facility].

## 2021-08-23 NOTE — Telephone Encounter (Signed)
Please see if [name] has an earlier availability and RS appt with patient.  She needs to be seen in the office.

## 2021-08-23 NOTE — Telephone Encounter (Signed)
If SOB or severe pain, go to ER. O/w Go back down to Seroquel 75 mg and take at those specific times she knows it works best for her. If she needs a new Rx, let me know.

## 2021-08-23 NOTE — Telephone Encounter (Signed)
For the record, she told me on 08/20/2021 that it did help, as long as she took it along with hydroxyzine, at a specific time. It's not appropriate to start her on a new medication, in a class that she's never taken before, without seeing her and discussing it. I need to see her in office at next visit. If I have any available appointments before 6/6, please sched her then.

## 2021-08-23 NOTE — Telephone Encounter (Signed)
LVM to [facility] 

## 2021-08-23 NOTE — Telephone Encounter (Signed)
Called patient to provide this information. She said the Seroquel hasn't been helping with the nightmares that are a result of her PTSD and is asking to change medication.  I told her she had an appt with you on 6/6 but she doesn't want to wait until then to make a change.

## 2021-08-24 NOTE — Telephone Encounter (Signed)
Patient has appt with Helene Kelp on Monday and will discuss with her at that time.

## 2021-08-27 ENCOUNTER — Encounter: Payer: Self-pay | Admitting: Physician Assistant

## 2021-08-27 ENCOUNTER — Ambulatory Visit (INDEPENDENT_AMBULATORY_CARE_PROVIDER_SITE_OTHER): Payer: BC Managed Care – PPO | Admitting: Physician Assistant

## 2021-08-27 VITALS — BP 116/69 | HR 80

## 2021-08-27 DIAGNOSIS — F514 Sleep terrors [night terrors]: Secondary | ICD-10-CM

## 2021-08-27 DIAGNOSIS — F411 Generalized anxiety disorder: Secondary | ICD-10-CM

## 2021-08-27 DIAGNOSIS — F4310 Post-traumatic stress disorder, unspecified: Secondary | ICD-10-CM

## 2021-08-27 DIAGNOSIS — G4700 Insomnia, unspecified: Secondary | ICD-10-CM

## 2021-08-27 DIAGNOSIS — F902 Attention-deficit hyperactivity disorder, combined type: Secondary | ICD-10-CM

## 2021-08-27 MED ORDER — AMPHETAMINE-DEXTROAMPHETAMINE 20 MG PO TABS: 20.0000 mg | ORAL_TABLET | Freq: Every day | ORAL | 0 refills | Status: DC

## 2021-08-27 MED ORDER — PRAZOSIN HCL 1 MG PO CAPS: ORAL_CAPSULE | ORAL | 1 refills | Status: DC

## 2021-08-27 NOTE — Progress Notes (Signed)
[facility]  Patient ID: Debra Osborne,  MRN: [id number]  PCP: Debra Housekeeper, MD  Date of Evaluation: 08/27/2021  time spent:30 minutes  Chief Complaint:  Chief Complaint   Post-Traumatic Stress Disorder; Follow-up; Anxiety; ADD; Other     HISTORY/CURRENT STATUS: HPI For routine med check.  Debra Osborne called on 08/23/2021 stating that the increased dose of Seroquel caused chest tightness.  Also did not help nightmares.  She had reported to me in the past appointment that it did help the nightmares but stopped.  She requested a medication change be made.  Has PTSD from childhood emotional trauma, she does not elaborate though.  Does not have flashbacks but vivid scary dreams.  She wakes up often not sure if she is asleep or awake.  She never feels rested when she gets up.  Even with the Seroquel.  Anxiety is still a problem, not really having panic attacks but feels like it could turn into that if she does not take the hydroxyzine.  Does not take it often though.  BuSpar has helped prevent panic attacks from happening.  Is able to enjoy things.  Looking forward to going to [place] with her grandmother over the summer to see the family.  Energy and motivation are good but of course depending on her level of fatigue from lack of sleep the night before.  Not isolating.  ADLs and personal hygiene are normal.  Appetite and weight are stable.  No laxative use, calorie restricting, or binging and purging.  No reports of self-harm of any kind.  No suicidal or homicidal thoughts.  States that attention is good without easy distractibility.  Able to focus on things and finish tasks to completion.  Adderall is still effective.  Patient denies increased energy with decreased need for sleep, no increased talkativeness, no racing thoughts, no impulsivity or risky behaviors, no increased spending, no increased libido, no grandiosity, no increased irritability or anger, no paranoia, and no  hallucinations.  Denies dizziness, syncope, seizures, numbness, tingling, tremor, tics, unsteady gait, slurred speech, confusion. Denies muscle or joint pain, stiffness, or dystonia. Denies unexplained weight loss, frequent infections, or sores that heal slowly.  No polyphagia, polydipsia, or polyuria. Denies visual changes or paresthesias.   Individual Medical History/ Review of Systems: Changes? :No    Past medications for mental health diagnoses include: Concerta, Ritalin, Seroquel caused chest tightness at 200 mg or higher, Buspar, Adderall, Hydroxyzine, prazosin  Allergies: Latex and Cephalosporins  Current Medications:  Current Outpatient Medications:    albuterol (VENTOLIN HFA) 108 (90 Base) MCG/ACT inhaler, Inhale 1-2 puffs into the lungs every 4 (four) hours as needed., Disp: , Rfl:    amphetamine-dextroamphetamine (ADDERALL XR) 30 MG 24 hr capsule, Take 1 capsule (30 mg total) by mouth daily., Disp: 31 capsule, Rfl: 0   amphetamine-dextroamphetamine (ADDERALL XR) 30 MG 24 hr capsule, Take 1 capsule (30 mg total) by mouth daily., Disp: 31 capsule, Rfl: 0   amphetamine-dextroamphetamine (ADDERALL XR) 30 MG 24 hr capsule, Take 1 capsule (30 mg total) by mouth daily., Disp: 31 capsule, Rfl: 0   budesonide-formoterol (SYMBICORT) 80-4.5 MCG/ACT inhaler, 2 puffs, Disp: , Rfl:    busPIRone (BUSPAR) 15 MG tablet, Take 1 tablet (15 mg total) by mouth 3 (three) times daily., Disp: 90 tablet, Rfl: 5   clindamycin (CLINDAGEL) 1 % gel, Apply 1 application topically daily., Disp: , Rfl:    EPINEPHrine 0.3 mg/0.3 mL IJ SOAJ injection, See admin instructions., Disp: , Rfl:    hydrOXYzine (  ATARAX) 25 MG tablet, Take 1-2 tablets (25-50 mg total) by mouth every 8 (eight) hours as needed., Disp: 90 tablet, Rfl: 5   levocetirizine (XYZAL) 5 MG tablet, Take 5 mg by mouth daily., Disp: , Rfl:    norgestimate-ethinyl estradiol (ORTHO-CYCLEN) 0.25-35 MG-MCG tablet, , Disp: , Rfl:    prazosin (MINIPRESS) 1 MG  capsule, 1 po qhs for 4 nights and then may increase to 2 po qhs., Disp: 60 capsule, Rfl: 1   tretinoin (RETIN-A) 0.1 % cream, Apply 1 application topically at bedtime., Disp: , Rfl:    UNABLE TO FIND, NaCl 500 mg daily, Disp: , Rfl:    amphetamine-dextroamphetamine (ADDERALL) 20 MG tablet, Take 1 tablet (20 mg total) by mouth daily in the afternoon. prn, Disp: 30 tablet, Rfl: 0 Medication Side Effects:  chest tightness with seroquel  Family Medical/ Social History: Changes? No  MENTAL HEALTH EXAM:  Blood pressure 116/69, pulse 80.There is no height or weight on file to calculate BMI.  General Appearance: Casual, Neat and Well Groomed  Eye Contact:  Good  Speech:  Clear and Coherent and Normal Rate  Volume:  Normal  Mood:  Euthymic  Affect:  Congruent  Thought Process:  Goal Directed and Descriptions of Associations: Intact  Orientation:  Full (Time, Place, and Person)  Thought Content: Logical   Suicidal Thoughts:  No  Homicidal Thoughts:  No  Memory:  WNL  Judgement:  Good  Insight:  Good  Psychomotor Activity:  Normal  Concentration:  Concentration: Good and Attention Span: Good  Recall:  Good  Fund of Knowledge: Good  Language: Good  Assets:  Desire for Improvement Financial Resources/Insurance Housing Transportation  ADL's:  Intact  Cognition: WNL  Prognosis:  Good    DIAGNOSES:    ICD-10-CM   1. Night terrors  F51.4     2. PTSD (post-traumatic stress disorder)  F43.10     3. Attention deficit hyperactivity disorder (ADHD), combined type, moderate  F90.2     4. Generalized anxiety disorder  F41.1     5. Insomnia, unspecified type  G47.00       Receiving Psychotherapy: Yes    in both DBT and talk therapy   RECOMMENDATIONS:  PDMP reviewed.  Last Adderall XR filled 08/01/2021.  Last IR was filled 06/11/2021. I provided 30 minutes of face to face time during this encounter, including time spent before and after the visit in records review, medical decision  making, counseling pertinent to today's visit, and charting.  We discussed the different options for nightmares and PTSD.  Since the Seroquel is possibly causing chest tightness and she now reports it is not effective then we will stop that. Recommend adding prazosin or doxazosin.  Benefits, risks and side effects were discussed and she would like to try it.  She understands to get up slowly from a seated position to help prevent orthostatic hypotension.  Drink plenty of fluids, a glass of Gatorade a day as well as other liquids.  If she starts having dizziness or presyncope, stop the medication and call.  Continue Adderall XR 30 mg, 1 p.o. every morning. Continue Adderall IR 20 mg, 1 p.o. q. afternoon as needed. Continue BuSpar 15 mg, 1 p.o. 3 times daily.  (She is usually only taking 1/2 pill mid day and that is effective for now.) Continue hydroxyzine 25 mg, 1-2 p.o. 3 times daily as needed anxiety or sleep. Start prazosin 1 mg, 1 p.o. nightly for 4 nights and then may increase to  2 p.o. nightly.  If she is responding to 1 pill then she can stay at that.  If she is not responding to the 2 pills, call our office and if her blood pressures are staying within normal range we could increase the dose even further.  She verbalizes understanding. Discontinue Seroquel. Continue therapy. Return in 2 wks.  Melony Overly, PA-C

## 2021-08-28 DIAGNOSIS — F439 Reaction to severe stress, unspecified: Secondary | ICD-10-CM | POA: Diagnosis not present

## 2021-08-28 DIAGNOSIS — F908 Attention-deficit hyperactivity disorder, other type: Secondary | ICD-10-CM | POA: Diagnosis not present

## 2021-08-28 DIAGNOSIS — F410 Panic disorder [episodic paroxysmal anxiety] without agoraphobia: Secondary | ICD-10-CM | POA: Diagnosis not present

## 2021-09-11 ENCOUNTER — Encounter: Payer: Self-pay | Admitting: Physician Assistant

## 2021-09-11 ENCOUNTER — Ambulatory Visit (INDEPENDENT_AMBULATORY_CARE_PROVIDER_SITE_OTHER): Payer: BC Managed Care – PPO | Admitting: Physician Assistant

## 2021-09-11 VITALS — BP 123/76 | HR 77

## 2021-09-11 DIAGNOSIS — F900 Attention-deficit hyperactivity disorder, predominantly inattentive type: Secondary | ICD-10-CM | POA: Diagnosis not present

## 2021-09-11 DIAGNOSIS — F411 Generalized anxiety disorder: Secondary | ICD-10-CM | POA: Diagnosis not present

## 2021-09-11 DIAGNOSIS — F902 Attention-deficit hyperactivity disorder, combined type: Secondary | ICD-10-CM | POA: Diagnosis not present

## 2021-09-11 DIAGNOSIS — F4310 Post-traumatic stress disorder, unspecified: Secondary | ICD-10-CM | POA: Diagnosis not present

## 2021-09-11 DIAGNOSIS — F514 Sleep terrors [night terrors]: Secondary | ICD-10-CM | POA: Diagnosis not present

## 2021-09-11 DIAGNOSIS — F4312 Post-traumatic stress disorder, chronic: Secondary | ICD-10-CM | POA: Diagnosis not present

## 2021-09-11 DIAGNOSIS — F410 Panic disorder [episodic paroxysmal anxiety] without agoraphobia: Secondary | ICD-10-CM | POA: Diagnosis not present

## 2021-09-11 MED ORDER — HYDROXYZINE HCL 50 MG PO TABS: 50.0000 mg | ORAL_TABLET | Freq: Two times a day (BID) | ORAL | 0 refills | Status: DC | PRN

## 2021-09-11 MED ORDER — BUSPIRONE HCL 15 MG PO TABS: 15.0000 mg | ORAL_TABLET | Freq: Three times a day (TID) | ORAL | 1 refills | Status: DC

## 2021-09-11 MED ORDER — AMPHETAMINE-DEXTROAMPHETAMINE 20 MG PO TABS: 20.0000 mg | ORAL_TABLET | Freq: Every day | ORAL | 0 refills | Status: DC

## 2021-09-11 MED ORDER — PRAZOSIN HCL 1 MG PO CAPS
ORAL_CAPSULE | ORAL | 1 refills | Status: DC
Start: 2021-09-11 — End: 2021-09-20

## 2021-09-11 MED ORDER — AMPHETAMINE-DEXTROAMPHET ER 30 MG PO CP24: 30.0000 mg | ORAL_CAPSULE | Freq: Every day | ORAL | 0 refills | Status: DC

## 2021-09-11 NOTE — Progress Notes (Signed)
[facility]  Patient ID: Debra Osborne,  MRN: [id number]  PCP: Georgann Housekeeper, MD  Date of Evaluation: 09/11/2021 time spent:20 minutes  Chief Complaint:  Chief Complaint   Anxiety; ADD; Insomnia; Follow-up     HISTORY/CURRENT STATUS: HPI For routine med check.  Added Prazosin 2 wks ago for night terrors. It's helped some. Dreams aren't as disturbing. Has had some dizziness, when first started med and also when increased dose. But no syncope. Plus she knows what orthostatic hypotension is and how to prevent fainting, already takes precautions.   Is able to enjoy things.  Looking forward to going to [place] with her grandmother over the summer to see the family.  Energy and motivation are good.  Not isolating.  ADLs and personal hygiene are normal.  Appetite and weight are stable.  No laxative use, calorie restricting, or binging and purging.  No reports of self-harm of any kind.  No suicidal or homicidal thoughts.  States that attention is good without easy distractibility.  Able to focus on things and finish tasks to completion.  Adderall is still effective.  Patient denies increased energy with decreased need for sleep, no increased talkativeness, no racing thoughts, no impulsivity or risky behaviors, no increased spending, no increased libido, no grandiosity, no increased irritability or anger, no paranoia, and no hallucinations.  Denies dizziness, syncope, seizures, numbness, tingling, tremor, tics, unsteady gait, slurred speech, confusion. Denies muscle or joint pain, stiffness, or dystonia. Denies unexplained weight loss, frequent infections, or sores that heal slowly.  No polyphagia, polydipsia, or polyuria. Denies visual changes or paresthesias.   Individual Medical History/ Review of Systems: Changes? :No    Past medications for mental health diagnoses include: Concerta, Ritalin, Seroquel caused chest tightness at 200 mg or higher, Buspar, Adderall, Hydroxyzine,  prazosin  Allergies: Latex and Cephalosporins  Current Medications:  Current Outpatient Medications:    albuterol (VENTOLIN HFA) 108 (90 Base) MCG/ACT inhaler, Inhale 1-2 puffs into the lungs every 4 (four) hours as needed., Disp: , Rfl:    amphetamine-dextroamphetamine (ADDERALL XR) 30 MG 24 hr capsule, Take 1 capsule (30 mg total) by mouth daily., Disp: 31 capsule, Rfl: 0   amphetamine-dextroamphetamine (ADDERALL XR) 30 MG 24 hr capsule, Take 1 capsule (30 mg total) by mouth daily., Disp: 31 capsule, Rfl: 0   budesonide-formoterol (SYMBICORT) 80-4.5 MCG/ACT inhaler, 2 puffs, Disp: , Rfl:    clindamycin (CLINDAGEL) 1 % gel, Apply 1 application topically daily., Disp: , Rfl:    EPINEPHrine 0.3 mg/0.3 mL IJ SOAJ injection, See admin instructions. (Patient not taking: Reported on 09/20/2021), Disp: , Rfl:    hydrOXYzine (ATARAX) 50 MG tablet, Take 1-2 tablets (50-100 mg total) by mouth 2 (two) times daily as needed., Disp: 180 tablet, Rfl: 0   levocetirizine (XYZAL) 5 MG tablet, Take 5 mg by mouth daily., Disp: , Rfl:    norgestimate-ethinyl estradiol (ORTHO-CYCLEN) 0.25-35 MG-MCG tablet, , Disp: , Rfl:    tretinoin (RETIN-A) 0.1 % cream, Apply 1 application topically at bedtime., Disp: , Rfl:    amphetamine-dextroamphetamine (ADDERALL XR) 30 MG 24 hr capsule, Take 1 capsule (30 mg total) by mouth daily., Disp: 30 capsule, Rfl: 0   amphetamine-dextroamphetamine (ADDERALL) 20 MG tablet, Take 1 tablet (20 mg total) by mouth daily in the afternoon. prn, Disp: 30 tablet, Rfl: 0   busPIRone (BUSPAR) 15 MG tablet, Take 1 tablet (15 mg total) by mouth 3 (three) times daily., Disp: 270 tablet, Rfl: 1   prazosin (MINIPRESS) 1 MG capsule, Take  3 capsules (3 mg total) by mouth at bedtime., Disp: 270 capsule, Rfl: 0   UNABLE TO FIND, NaCl 500 mg daily, Disp: , Rfl:  Medication Side Effects:  chest tightness with seroquel  Family Medical/ Social History: Changes? No  MENTAL HEALTH EXAM:  Blood pressure  123/76, pulse 77.There is no height or weight on file to calculate BMI.  General Appearance: Casual, Neat and Well Groomed  Eye Contact:  Good  Speech:  Clear and Coherent and Normal Rate  Volume:  Normal  Mood:  Euthymic  Affect:  Congruent  Thought Process:  Goal Directed and Descriptions of Associations: Circumstantial  Orientation:  Full (Time, Place, and Person)  Thought Content: Logical   Suicidal Thoughts:  No  Homicidal Thoughts:  No  Memory:  WNL  Judgement:  Good  Insight:  Good  Psychomotor Activity:  Normal  Concentration:  Concentration: Good and Attention Span: Good  Recall:  Good  Fund of Knowledge: Good  Language: Good  Assets:  Desire for Improvement Financial Resources/Insurance Housing Transportation  ADL's:  Intact  Cognition: WNL  Prognosis:  Good    DIAGNOSES:    ICD-10-CM   1. Night terrors  F51.4     2. PTSD (post-traumatic stress disorder)  F43.10     3. Attention deficit hyperactivity disorder (ADHD), combined type, moderate  F90.2     4. Generalized anxiety disorder  F41.1        Receiving Psychotherapy: Yes    in both DBT and talk therapy   RECOMMENDATIONS:  PDMP reviewed.  Last Adderall XR and IR filled 08/30/2021.   I provided 20 minutes of face to face time during this encounter, including time spent before and after the visit in records review, medical decision making, counseling pertinent to today's visit, and charting.  Disc increasing Prazosin since it is effective, just not quite enough.  Cont orthostatic hypotension precautions.  Continue Adderall XR 30 mg, 1 p.o. every morning. Continue Adderall IR 20 mg, 1 p.o. q. afternoon as needed. Continue BuSpar 15 mg, 1 p.o. 3 times daily.   Continue hydroxyzine 25 mg, 1-2 p.o. 3 times daily as needed anxiety or sleep. Increase Prazosin 1 mg to 3 po qhs.  Continue therapy. Return in 2 wks.  Melony Overly, PA-C

## 2021-09-17 DIAGNOSIS — L700 Acne vulgaris: Secondary | ICD-10-CM | POA: Diagnosis not present

## 2021-09-20 ENCOUNTER — Encounter: Payer: Self-pay | Admitting: Physician Assistant

## 2021-09-20 ENCOUNTER — Telehealth (INDEPENDENT_AMBULATORY_CARE_PROVIDER_SITE_OTHER): Payer: BC Managed Care – PPO | Admitting: Physician Assistant

## 2021-09-20 DIAGNOSIS — F902 Attention-deficit hyperactivity disorder, combined type: Secondary | ICD-10-CM | POA: Diagnosis not present

## 2021-09-20 DIAGNOSIS — F4310 Post-traumatic stress disorder, unspecified: Secondary | ICD-10-CM

## 2021-09-20 DIAGNOSIS — F514 Sleep terrors [night terrors]: Secondary | ICD-10-CM

## 2021-09-20 DIAGNOSIS — F411 Generalized anxiety disorder: Secondary | ICD-10-CM | POA: Diagnosis not present

## 2021-09-20 MED ORDER — PRAZOSIN HCL 1 MG PO CAPS
3.0000 mg | ORAL_CAPSULE | Freq: Every day | ORAL | 0 refills | Status: DC
Start: 2021-09-20 — End: 2022-01-29

## 2021-09-20 NOTE — Progress Notes (Signed)
[facility]  Patient ID: Debra Osborne,  MRN: [id number]  PCP: Georgann Housekeeper, MD  Date of Evaluation: 09/20/2021 time spent:20 minutes  Chief Complaint:  Chief Complaint   Anxiety; Depression; Follow-up     HISTORY/CURRENT STATUS: HPI For routine med check.  States she is doing "a lot better."  She is no longer having nightmares and does not even remember many dreams at all now.  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  She is at 3 mg and that is helping enough.  She had some dizziness when she first increased the dose but has not had any dizziness since then.  She is aware of ways to help with orthostatic hypotension so knows not to get up too quickly.  She has not had any presyncope or syncopal episodes.  She is leaving in 3 days to go to [place] for the summer.  Excited but a little nervous about it as well.  Has been many times to visit family there.  She is able to enjoy things.  Energy and motivation are good.  Appetite and weight are stable.  ADLs and personal hygiene are normal.  She is not isolating.  Anxiety is well controlled.  No suicidal or homicidal thoughts.  States that attention is good without easy distractibility.  Able to focus on things and finish tasks to completion.   Patient denies increased energy with decreased need for sleep, no increased talkativeness, no racing thoughts, no impulsivity or risky behaviors, no increased spending, no increased libido, no grandiosity, no increased irritability or anger, and no hallucinations.  Denies dizziness, syncope, seizures, numbness, tingling, tremor, tics, unsteady gait, slurred speech, confusion. Denies muscle or joint pain, stiffness, or dystonia. Denies unexplained weight loss, frequent infections, or sores that heal slowly.  No polyphagia, polydipsia, or polyuria. Denies visual changes or paresthesias.   Individual Medical History/ Review of Systems: Changes? :No    Past medications for mental health diagnoses  include: Concerta, Ritalin, Seroquel caused chest tightness at 200 mg or higher, Buspar, Adderall, Hydroxyzine, prazosin  Allergies: Latex and Cephalosporins  Current Medications:  Current Outpatient Medications:    albuterol (VENTOLIN HFA) 108 (90 Base) MCG/ACT inhaler, Inhale 1-2 puffs into the lungs every 4 (four) hours as needed., Disp: , Rfl:    amphetamine-dextroamphetamine (ADDERALL XR) 30 MG 24 hr capsule, Take 1 capsule (30 mg total) by mouth daily., Disp: 31 capsule, Rfl: 0   amphetamine-dextroamphetamine (ADDERALL XR) 30 MG 24 hr capsule, Take 1 capsule (30 mg total) by mouth daily., Disp: 31 capsule, Rfl: 0   [START ON 09/21/2021] amphetamine-dextroamphetamine (ADDERALL XR) 30 MG 24 hr capsule, Take 1 capsule (30 mg total) by mouth daily., Disp: 30 capsule, Rfl: 0   [START ON 09/21/2021] amphetamine-dextroamphetamine (ADDERALL) 20 MG tablet, Take 1 tablet (20 mg total) by mouth daily in the afternoon. prn, Disp: 30 tablet, Rfl: 0   budesonide-formoterol (SYMBICORT) 80-4.5 MCG/ACT inhaler, 2 puffs, Disp: , Rfl:    busPIRone (BUSPAR) 15 MG tablet, Take 1 tablet (15 mg total) by mouth 3 (three) times daily., Disp: 270 tablet, Rfl: 1   clindamycin (CLINDAGEL) 1 % gel, Apply 1 application topically daily., Disp: , Rfl:    hydrOXYzine (ATARAX) 50 MG tablet, Take 1-2 tablets (50-100 mg total) by mouth 2 (two) times daily as needed., Disp: 180 tablet, Rfl: 0   levocetirizine (XYZAL) 5 MG tablet, Take 5 mg by mouth daily., Disp: , Rfl:    norgestimate-ethinyl estradiol (ORTHO-CYCLEN) 0.25-35 MG-MCG tablet, , Disp: , Rfl:  tretinoin (RETIN-A) 0.1 % cream, Apply 1 application topically at bedtime., Disp: , Rfl:    UNABLE TO FIND, NaCl 500 mg daily, Disp: , Rfl:    EPINEPHrine 0.3 mg/0.3 mL IJ SOAJ injection, See admin instructions. (Patient not taking: Reported on 09/20/2021), Disp: , Rfl:    prazosin (MINIPRESS) 1 MG capsule, Take 3 capsules (3 mg total) by mouth at bedtime., Disp: 270 capsule,  Rfl: 0 Medication Side Effects:  chest tightness with seroquel  Family Medical/ Social History: Changes? No  MENTAL HEALTH EXAM:  There were no vitals taken for this visit.There is no height or weight on file to calculate BMI.  General Appearance: Casual, Neat and Well Groomed  Eye Contact:  Good  Speech:  Clear and Coherent and Normal Rate  Volume:  Normal  Mood:  Euthymic  Affect:  Congruent  Thought Process:  Goal Directed and Descriptions of Associations: Circumstantial  Orientation:  Full (Time, Place, and Person)  Thought Content: Logical   Suicidal Thoughts:  No  Homicidal Thoughts:  No  Memory:  WNL  Judgement:  Good  Insight:  Good  Psychomotor Activity:  Normal  Concentration:  Concentration: Good and Attention Span: Good  Recall:  Good  Fund of Knowledge: Good  Language: Good  Assets:  Desire for Improvement Financial Resources/Insurance Housing Transportation  ADL's:  Intact  Cognition: WNL  Prognosis:  Good    DIAGNOSES:    ICD-10-CM   1. Night terrors  F51.4     2. PTSD (post-traumatic stress disorder)  F43.10     3. Attention deficit hyperactivity disorder (ADHD), combined type, moderate  F90.2     4. Generalized anxiety disorder  F41.1        Receiving Psychotherapy: Yes    in both DBT and talk therapy   RECOMMENDATIONS:  PDMP reviewed.  Last Adderall XR and IR filled 08/30/2021.   I provided 20 minutes of non-face-to-face time during this encounter, including time spent before and after the visit in records review, medical decision making, counseling pertinent to today's visit, and charting.  I am glad to see her doing so much better!  No changes are necessary.  Continue Adderall XR 30 mg, 1 p.o. every morning. Continue Adderall IR 20 mg, 1 p.o. q. afternoon as needed. Continue BuSpar 15 mg, 1 p.o. 3 times daily.   Continue hydroxyzine 25 mg, 1-2 p.o. 3 times daily as needed anxiety or sleep. Continue prazosin 1 mg, 3 p.o.  nightly. Continue therapy. Return in 2 months, after she returns from [place].  Melony Overly, PA-C

## 2021-09-23 ENCOUNTER — Encounter: Payer: Self-pay | Admitting: Physician Assistant

## 2021-11-05 ENCOUNTER — Telehealth: Payer: Self-pay | Admitting: Physician Assistant

## 2021-11-05 ENCOUNTER — Other Ambulatory Visit: Payer: Self-pay

## 2021-11-05 MED ORDER — AMPHETAMINE-DEXTROAMPHET ER 30 MG PO CP24
30.0000 mg | ORAL_CAPSULE | Freq: Every day | ORAL | 0 refills | Status: DC
Start: 2021-11-05 — End: 2022-02-14

## 2021-11-05 NOTE — Telephone Encounter (Signed)
Pt called and said that she needs a refill on her adderall xr 30 mg. Please send to [facility] in [place]

## 2021-11-05 NOTE — Telephone Encounter (Signed)
Pended.

## 2021-11-16 DIAGNOSIS — J45990 Exercise induced bronchospasm: Secondary | ICD-10-CM | POA: Diagnosis not present

## 2021-11-16 DIAGNOSIS — J301 Allergic rhinitis due to pollen: Secondary | ICD-10-CM | POA: Diagnosis not present

## 2021-11-16 DIAGNOSIS — J3081 Allergic rhinitis due to animal (cat) (dog) hair and dander: Secondary | ICD-10-CM | POA: Diagnosis not present

## 2021-11-16 DIAGNOSIS — J3089 Other allergic rhinitis: Secondary | ICD-10-CM | POA: Diagnosis not present

## 2021-11-21 DIAGNOSIS — F900 Attention-deficit hyperactivity disorder, predominantly inattentive type: Secondary | ICD-10-CM | POA: Diagnosis not present

## 2021-11-21 DIAGNOSIS — F4312 Post-traumatic stress disorder, chronic: Secondary | ICD-10-CM | POA: Diagnosis not present

## 2021-11-21 DIAGNOSIS — F410 Panic disorder [episodic paroxysmal anxiety] without agoraphobia: Secondary | ICD-10-CM | POA: Diagnosis not present

## 2021-12-11 ENCOUNTER — Other Ambulatory Visit: Payer: Self-pay

## 2021-12-11 ENCOUNTER — Telehealth: Payer: Self-pay | Admitting: Physician Assistant

## 2021-12-11 MED ORDER — AMPHETAMINE-DEXTROAMPHET ER 30 MG PO CP24: 30.0000 mg | ORAL_CAPSULE | Freq: Every day | ORAL | 0 refills | Status: DC

## 2021-12-11 NOTE — Telephone Encounter (Signed)
Pended.

## 2021-12-11 NOTE — Telephone Encounter (Signed)
Pt LVM for refill of Adderall XR sent to [facility].  No upcoming appt scheduled.

## 2021-12-11 NOTE — Telephone Encounter (Signed)
She said it is available there.

## 2021-12-17 DIAGNOSIS — Z79899 Other long term (current) drug therapy: Secondary | ICD-10-CM | POA: Diagnosis not present

## 2021-12-17 DIAGNOSIS — Z91040 Latex allergy status: Secondary | ICD-10-CM | POA: Diagnosis not present

## 2021-12-17 DIAGNOSIS — R200 Anesthesia of skin: Secondary | ICD-10-CM | POA: Diagnosis not present

## 2021-12-17 DIAGNOSIS — S91012A Laceration without foreign body, left ankle, initial encounter: Secondary | ICD-10-CM | POA: Diagnosis not present

## 2021-12-17 DIAGNOSIS — Z881 Allergy status to other antibiotic agents status: Secondary | ICD-10-CM | POA: Diagnosis not present

## 2021-12-17 DIAGNOSIS — J45909 Unspecified asthma, uncomplicated: Secondary | ICD-10-CM | POA: Diagnosis not present

## 2021-12-17 DIAGNOSIS — Z888 Allergy status to other drugs, medicaments and biological substances status: Secondary | ICD-10-CM | POA: Diagnosis not present

## 2021-12-17 DIAGNOSIS — W268XXA Contact with other sharp object(s), not elsewhere classified, initial encounter: Secondary | ICD-10-CM | POA: Diagnosis not present

## 2021-12-17 DIAGNOSIS — I951 Orthostatic hypotension: Secondary | ICD-10-CM | POA: Diagnosis not present

## 2021-12-28 DIAGNOSIS — F900 Attention-deficit hyperactivity disorder, predominantly inattentive type: Secondary | ICD-10-CM | POA: Diagnosis not present

## 2021-12-28 DIAGNOSIS — F410 Panic disorder [episodic paroxysmal anxiety] without agoraphobia: Secondary | ICD-10-CM | POA: Diagnosis not present

## 2021-12-28 DIAGNOSIS — F4312 Post-traumatic stress disorder, chronic: Secondary | ICD-10-CM | POA: Diagnosis not present

## 2022-01-08 DIAGNOSIS — L700 Acne vulgaris: Secondary | ICD-10-CM | POA: Diagnosis not present

## 2022-01-14 ENCOUNTER — Other Ambulatory Visit: Payer: Self-pay

## 2022-01-14 ENCOUNTER — Telehealth: Payer: Self-pay | Admitting: Physician Assistant

## 2022-01-14 MED ORDER — AMPHETAMINE-DEXTROAMPHET ER 30 MG PO CP24
30.0000 mg | ORAL_CAPSULE | Freq: Every day | ORAL | 0 refills | Status: DC
Start: 2022-01-14 — End: 2022-03-06

## 2022-01-14 NOTE — Telephone Encounter (Signed)
Please call patient to schedule an appt.-

## 2022-01-14 NOTE — Telephone Encounter (Signed)
Patient called in for refill on Adderall XR 30mg . Ph: [place], [place]

## 2022-01-14 NOTE — Telephone Encounter (Signed)
Pended.

## 2022-01-15 NOTE — Telephone Encounter (Signed)
Lvm for pt to call and schedule

## 2022-01-21 DIAGNOSIS — L700 Acne vulgaris: Secondary | ICD-10-CM | POA: Diagnosis not present

## 2022-01-21 DIAGNOSIS — Z5181 Encounter for therapeutic drug level monitoring: Secondary | ICD-10-CM | POA: Diagnosis not present

## 2022-01-22 DIAGNOSIS — F410 Panic disorder [episodic paroxysmal anxiety] without agoraphobia: Secondary | ICD-10-CM | POA: Diagnosis not present

## 2022-01-22 DIAGNOSIS — F4312 Post-traumatic stress disorder, chronic: Secondary | ICD-10-CM | POA: Diagnosis not present

## 2022-01-22 DIAGNOSIS — F900 Attention-deficit hyperactivity disorder, predominantly inattentive type: Secondary | ICD-10-CM | POA: Diagnosis not present

## 2022-01-28 ENCOUNTER — Other Ambulatory Visit: Payer: Self-pay | Admitting: Physician Assistant

## 2022-01-28 NOTE — Telephone Encounter (Signed)
Pt called and said that she is taking 4 pills a day and that is working. So please write a new script for 4 a day

## 2022-01-29 NOTE — Telephone Encounter (Signed)
Filled 9/25

## 2022-02-06 ENCOUNTER — Telehealth: Payer: Self-pay | Admitting: Physician Assistant

## 2022-02-06 ENCOUNTER — Other Ambulatory Visit: Payer: Self-pay

## 2022-02-06 ENCOUNTER — Other Ambulatory Visit: Payer: Self-pay | Admitting: Physician Assistant

## 2022-02-06 MED ORDER — HYDROXYZINE HCL 50 MG PO TABS: 50.0000 mg | ORAL_TABLET | Freq: Two times a day (BID) | ORAL | 0 refills | Status: DC | PRN

## 2022-02-06 NOTE — Telephone Encounter (Signed)
Hydroxyzine sent adderall due 11/9

## 2022-02-06 NOTE — Telephone Encounter (Signed)
Pt has an app[t 11/29. She needs her adderall and hydroxyzine filled

## 2022-02-14 ENCOUNTER — Other Ambulatory Visit: Payer: Self-pay

## 2022-02-14 MED ORDER — AMPHETAMINE-DEXTROAMPHET ER 30 MG PO CP24
30.0000 mg | ORAL_CAPSULE | Freq: Every day | ORAL | 0 refills | Status: DC
Start: 2022-02-14 — End: 2022-03-06

## 2022-02-19 DIAGNOSIS — F410 Panic disorder [episodic paroxysmal anxiety] without agoraphobia: Secondary | ICD-10-CM | POA: Diagnosis not present

## 2022-02-19 DIAGNOSIS — F900 Attention-deficit hyperactivity disorder, predominantly inattentive type: Secondary | ICD-10-CM | POA: Diagnosis not present

## 2022-02-19 DIAGNOSIS — F4312 Post-traumatic stress disorder, chronic: Secondary | ICD-10-CM | POA: Diagnosis not present

## 2022-03-05 DIAGNOSIS — F900 Attention-deficit hyperactivity disorder, predominantly inattentive type: Secondary | ICD-10-CM | POA: Diagnosis not present

## 2022-03-05 DIAGNOSIS — F410 Panic disorder [episodic paroxysmal anxiety] without agoraphobia: Secondary | ICD-10-CM | POA: Diagnosis not present

## 2022-03-05 DIAGNOSIS — F4312 Post-traumatic stress disorder, chronic: Secondary | ICD-10-CM | POA: Diagnosis not present

## 2022-03-06 ENCOUNTER — Telehealth (INDEPENDENT_AMBULATORY_CARE_PROVIDER_SITE_OTHER): Payer: BC Managed Care – PPO | Admitting: Physician Assistant

## 2022-03-06 ENCOUNTER — Encounter: Payer: Self-pay | Admitting: Physician Assistant

## 2022-03-06 DIAGNOSIS — F902 Attention-deficit hyperactivity disorder, combined type: Secondary | ICD-10-CM

## 2022-03-06 DIAGNOSIS — F4310 Post-traumatic stress disorder, unspecified: Secondary | ICD-10-CM

## 2022-03-06 DIAGNOSIS — F418 Other specified anxiety disorders: Secondary | ICD-10-CM

## 2022-03-06 DIAGNOSIS — F514 Sleep terrors [night terrors]: Secondary | ICD-10-CM | POA: Diagnosis not present

## 2022-03-06 DIAGNOSIS — F411 Generalized anxiety disorder: Secondary | ICD-10-CM

## 2022-03-06 MED ORDER — AMPHETAMINE-DEXTROAMPHET ER 30 MG PO CP24: 30.0000 mg | ORAL_CAPSULE | Freq: Every day | ORAL | 0 refills | Status: DC

## 2022-03-06 MED ORDER — AMPHETAMINE-DEXTROAMPHETAMINE 20 MG PO TABS: 20.0000 mg | ORAL_TABLET | Freq: Every day | ORAL | 0 refills | Status: DC

## 2022-03-06 MED ORDER — PRAZOSIN HCL 1 MG PO CAPS
4.0000 mg | ORAL_CAPSULE | Freq: Every day | ORAL | 1 refills | Status: DC
Start: 2022-03-06 — End: 2022-09-03

## 2022-03-06 MED ORDER — BUSPIRONE HCL 15 MG PO TABS: 15.0000 mg | ORAL_TABLET | Freq: Four times a day (QID) | ORAL | 1 refills | Status: DC

## 2022-03-06 NOTE — Progress Notes (Signed)
[facility]  Patient ID: Debra Osborne,  MRN: [id number]  PCP: Georgann Housekeeper, MD  Date of Evaluation: 03/06/2022 time spent:20 minutes  Chief Complaint:  Chief Complaint   Anxiety; ADD; Follow-up    Virtual Visit via Telehealth  I connected with patient by a video enabled telemedicine application  with their informed consent, and verified patient privacy and that I am speaking with the correct person using two identifiers.  I am private, in my office and the patient is at home.  I discussed the limitations, risks, security and privacy concerns of performing an evaluation and management service by telephone video and the availability of in person appointments. I also discussed with the patient that there may be a patient responsible charge related to this service. The patient expressed understanding and agreed to proceed.   I discussed the assessment and treatment plan with the patient. The patient was provided an opportunity to ask questions and all were answered. The patient agreed with the plan and demonstrated an understanding of the instructions.   The patient was advised to call back or seek an in-person evaluation if the symptoms worsen or if the condition fails to improve as anticipated.  I provided 20  minutes of non-face-to-face time during this encounter.  HISTORY/CURRENT STATUS: HPI For routine med check.  Has been more able [employer] the past few weeks.  Not sure if the BuSpar is working as well as it once did.  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 She'll know by Christmas and will officially transfer programs next fall. So very busy.  Patient is able to enjoy things.  Energy and motivation are good.   No extreme sadness, tearfulness, or feelings of hopelessness.  Sleeps well most of the time.  The prazosin is working very well, no nightmares  or disturbing dreams.  ADLs and personal hygiene are normal.  Appetite has not changed.  Weight is stable.  Denies laxative use, calorie restricting, or binging and purging.   Denies cutting or any form of self-harm.  Denies suicidal or homicidal thoughts.  States that attention is good without easy distractibility.  Able to focus on things and finish tasks to completion.   Patient denies increased energy with decreased need for sleep, increased talkativeness, racing thoughts, impulsivity or risky behaviors, increased spending, increased libido, grandiosity, increased irritability or anger, paranoia, or hallucinations.  Denies dizziness, syncope, seizures, numbness, tingling, tremor, tics, unsteady gait, slurred speech, confusion. Denies muscle or joint pain, stiffness, or dystonia. Denies unexplained weight loss, frequent infections, or sores that heal slowly.  No polyphagia, polydipsia, or polyuria. Denies visual changes or paresthesias.   Individual Medical History/ Review of Systems: Changes? :No    Past medications for mental health diagnoses include: Concerta, Ritalin, Seroquel caused chest tightness at 200 mg or higher, Buspar, Adderall, Hydroxyzine, prazosin  Allergies: Latex and Cephalosporins  Current Medications:  Current Outpatient Medications:    albuterol (VENTOLIN HFA) 108 (90 Base) MCG/ACT inhaler, Inhale 1-2 puffs into the lungs every 4 (four) hours as needed., Disp: , Rfl:    budesonide-formoterol (SYMBICORT) 80-4.5 MCG/ACT inhaler, 2 puffs, Disp: , Rfl:    clindamycin (CLINDAGEL) 1 % gel, Apply 1 application topically daily., Disp: , Rfl:    hydrOXYzine (ATARAX) 50 MG tablet, Take 1-2 tablets (50-100 mg total) by mouth 2 (two) times daily as needed., Disp:  180 tablet, Rfl: 0   levocetirizine (XYZAL) 5 MG tablet, Take 5 mg by mouth daily., Disp: , Rfl:    norgestimate-ethinyl estradiol (ORTHO-CYCLEN) 0.25-35 MG-MCG tablet, , Disp: , Rfl:    tretinoin (RETIN-A) 0.1 % cream,  Apply 1 application topically at bedtime., Disp: , Rfl:    [START ON 03/15/2022] amphetamine-dextroamphetamine (ADDERALL XR) 30 MG 24 hr capsule, Take 1 capsule (30 mg total) by mouth daily., Disp: 31 capsule, Rfl: 0   [START ON 04/14/2022] amphetamine-dextroamphetamine (ADDERALL XR) 30 MG 24 hr capsule, Take 1 capsule (30 mg total) by mouth daily., Disp: 31 capsule, Rfl: 0   [START ON 05/14/2022] amphetamine-dextroamphetamine (ADDERALL XR) 30 MG 24 hr capsule, Take 1 capsule (30 mg total) by mouth daily., Disp: 31 capsule, Rfl: 0   amphetamine-dextroamphetamine (ADDERALL) 20 MG tablet, Take 1 tablet (20 mg total) by mouth daily in the afternoon. prn, Disp: 31 tablet, Rfl: 0   busPIRone (BUSPAR) 15 MG tablet, Take 1 tablet (15 mg total) by mouth 4 (four) times daily., Disp: 360 tablet, Rfl: 1   EPINEPHrine 0.3 mg/0.3 mL IJ SOAJ injection, See admin instructions. (Patient not taking: Reported on 09/20/2021), Disp: , Rfl:    prazosin (MINIPRESS) 1 MG capsule, Take 4 capsules (4 mg total) by mouth at bedtime., Disp: 360 capsule, Rfl: 1 Medication Side Effects:  Occasional dizziness when she takes the BuSpar, does not occur every time.  Family Medical/ Social History: Changes? No  MENTAL HEALTH EXAM:  There were no vitals taken for this visit.There is no height or weight on file to calculate BMI.  General Appearance: Casual, Neat and Well Groomed  Eye Contact:  Good  Speech:  Clear and Coherent and Normal Rate  Volume:  Normal  Mood:  Euthymic  Affect:  Congruent  Thought Process:  Goal Directed and Descriptions of Associations: Circumstantial  Orientation:  Full (Time, Place, and Person)  Thought Content: Logical   Suicidal Thoughts:  No  Homicidal Thoughts:  No  Memory:  WNL  Judgement:  Good  Insight:  Good  Psychomotor Activity:  Normal  Concentration:  Concentration: Good and Attention Span: Good  Recall:  Good  Fund of Knowledge: Good  Language: Good  Assets:  Desire for Improvement   ADL's:  Intact  Cognition: WNL  Prognosis:  Good   DIAGNOSES:    ICD-10-CM   1. Situational anxiety  F41.8     2. Generalized anxiety disorder  F41.1     3. PTSD (post-traumatic stress disorder)  F43.10     4. Night terrors  F51.4     5. Attention deficit hyperactivity disorder (ADHD), combined type, moderate  F90.2       Receiving Psychotherapy: Yes    in both DBT and talk therapy  RECOMMENDATIONS:  PDMP reviewed.  Last Adderall XR filled 02/14/2022.  Adderall IR filled 09/21/2021. I provided 20  minutes of non-face-to-face time during this encounter, including time spent before and after the visit in records review, medical decision making, counseling pertinent to today's visit, and charting.   We discussed the anxiety, it is probably increased for a couple of different reasons.  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  Recommend increasing the BuSpar, she may only need it for a few weeks.  It is difficult to say how beneficial that will be in that short amount of time, but some providers prescribe it as  needed.  I think it is worth a try.  Continue Adderall XR 30 mg, 1 p.o. every morning. Continue Adderall IR 20 mg, 1 p.o. q. afternoon as needed. Increase BuSpar 15 mg to 1.5 pills every morning, 1 at lunch, and 1 in the evening.  If that is not effective and she tolerates it okay then she can take 2 p.o. every morning, 1 at lunch, and 1 in the evening.  Max dose is 60 mg/day.  She verbalizes understanding. Continue hydroxyzine 25 mg, 1-2 p.o. 3 times daily as needed anxiety or sleep. Continue prazosin 1 mg, 4 p.o. nightly. Continue therapy. Return in 3 months.  Melony Overly, PA-C

## 2022-03-19 ENCOUNTER — Other Ambulatory Visit: Payer: Self-pay | Admitting: Physician Assistant

## 2022-03-20 DIAGNOSIS — F410 Panic disorder [episodic paroxysmal anxiety] without agoraphobia: Secondary | ICD-10-CM | POA: Diagnosis not present

## 2022-03-20 DIAGNOSIS — Z23 Encounter for immunization: Secondary | ICD-10-CM | POA: Diagnosis not present

## 2022-03-20 DIAGNOSIS — F4312 Post-traumatic stress disorder, chronic: Secondary | ICD-10-CM | POA: Diagnosis not present

## 2022-03-20 DIAGNOSIS — F900 Attention-deficit hyperactivity disorder, predominantly inattentive type: Secondary | ICD-10-CM | POA: Diagnosis not present

## 2022-04-09 DIAGNOSIS — F410 Panic disorder [episodic paroxysmal anxiety] without agoraphobia: Secondary | ICD-10-CM | POA: Diagnosis not present

## 2022-04-09 DIAGNOSIS — F900 Attention-deficit hyperactivity disorder, predominantly inattentive type: Secondary | ICD-10-CM | POA: Diagnosis not present

## 2022-04-09 DIAGNOSIS — F4312 Post-traumatic stress disorder, chronic: Secondary | ICD-10-CM | POA: Diagnosis not present

## 2022-04-24 DIAGNOSIS — Z0000 Encounter for general adult medical examination without abnormal findings: Secondary | ICD-10-CM | POA: Diagnosis not present

## 2022-04-24 DIAGNOSIS — Z13220 Encounter for screening for lipoid disorders: Secondary | ICD-10-CM | POA: Diagnosis not present

## 2022-04-26 ENCOUNTER — Other Ambulatory Visit: Payer: Self-pay | Admitting: Physician Assistant

## 2022-05-03 DIAGNOSIS — F4312 Post-traumatic stress disorder, chronic: Secondary | ICD-10-CM | POA: Diagnosis not present

## 2022-05-03 DIAGNOSIS — F900 Attention-deficit hyperactivity disorder, predominantly inattentive type: Secondary | ICD-10-CM | POA: Diagnosis not present

## 2022-05-03 DIAGNOSIS — F410 Panic disorder [episodic paroxysmal anxiety] without agoraphobia: Secondary | ICD-10-CM | POA: Diagnosis not present

## 2022-05-14 DIAGNOSIS — J3081 Allergic rhinitis due to animal (cat) (dog) hair and dander: Secondary | ICD-10-CM | POA: Diagnosis not present

## 2022-05-14 DIAGNOSIS — J301 Allergic rhinitis due to pollen: Secondary | ICD-10-CM | POA: Diagnosis not present

## 2022-05-14 DIAGNOSIS — J45990 Exercise induced bronchospasm: Secondary | ICD-10-CM | POA: Diagnosis not present

## 2022-05-14 DIAGNOSIS — J3089 Other allergic rhinitis: Secondary | ICD-10-CM | POA: Diagnosis not present

## 2022-05-16 ENCOUNTER — Other Ambulatory Visit: Payer: Self-pay | Admitting: Physician Assistant

## 2022-05-17 DIAGNOSIS — F4312 Post-traumatic stress disorder, chronic: Secondary | ICD-10-CM | POA: Diagnosis not present

## 2022-05-17 DIAGNOSIS — F410 Panic disorder [episodic paroxysmal anxiety] without agoraphobia: Secondary | ICD-10-CM | POA: Diagnosis not present

## 2022-05-17 DIAGNOSIS — F900 Attention-deficit hyperactivity disorder, predominantly inattentive type: Secondary | ICD-10-CM | POA: Diagnosis not present

## 2022-05-22 DIAGNOSIS — Z0000 Encounter for general adult medical examination without abnormal findings: Secondary | ICD-10-CM | POA: Diagnosis not present

## 2022-05-22 DIAGNOSIS — Z23 Encounter for immunization: Secondary | ICD-10-CM | POA: Diagnosis not present

## 2022-06-05 ENCOUNTER — Telehealth: Payer: BC Managed Care – PPO | Admitting: Physician Assistant

## 2022-06-14 DIAGNOSIS — F900 Attention-deficit hyperactivity disorder, predominantly inattentive type: Secondary | ICD-10-CM | POA: Diagnosis not present

## 2022-06-14 DIAGNOSIS — F410 Panic disorder [episodic paroxysmal anxiety] without agoraphobia: Secondary | ICD-10-CM | POA: Diagnosis not present

## 2022-06-14 DIAGNOSIS — F4312 Post-traumatic stress disorder, chronic: Secondary | ICD-10-CM | POA: Diagnosis not present

## 2022-06-19 ENCOUNTER — Telehealth: Payer: Self-pay | Admitting: Physician Assistant

## 2022-06-19 ENCOUNTER — Other Ambulatory Visit: Payer: Self-pay | Admitting: Physician Assistant

## 2022-06-19 MED ORDER — AMPHETAMINE-DEXTROAMPHETAMINE 20 MG PO TABS: 20.0000 mg | ORAL_TABLET | Freq: Every day | ORAL | 0 refills | Status: DC

## 2022-06-19 MED ORDER — AMPHETAMINE-DEXTROAMPHET ER 30 MG PO CP24: 30.0000 mg | ORAL_CAPSULE | Freq: Every day | ORAL | 0 refills | Status: DC

## 2022-06-19 NOTE — Telephone Encounter (Signed)
[name] lvm requesting refills on the Adderall XR 30 mg and Adderall IR 20 mg. Fill at the [facility].  Appointment scheduled for 07/03/22 Contact # [phone]

## 2022-06-19 NOTE — Telephone Encounter (Signed)
sent 

## 2022-06-28 DIAGNOSIS — F4312 Post-traumatic stress disorder, chronic: Secondary | ICD-10-CM | POA: Diagnosis not present

## 2022-06-28 DIAGNOSIS — F410 Panic disorder [episodic paroxysmal anxiety] without agoraphobia: Secondary | ICD-10-CM | POA: Diagnosis not present

## 2022-06-28 DIAGNOSIS — F900 Attention-deficit hyperactivity disorder, predominantly inattentive type: Secondary | ICD-10-CM | POA: Diagnosis not present

## 2022-07-03 ENCOUNTER — Encounter: Payer: Self-pay | Admitting: Physician Assistant

## 2022-07-03 ENCOUNTER — Telehealth (INDEPENDENT_AMBULATORY_CARE_PROVIDER_SITE_OTHER): Payer: BC Managed Care – PPO | Admitting: Physician Assistant

## 2022-07-03 DIAGNOSIS — F902 Attention-deficit hyperactivity disorder, combined type: Secondary | ICD-10-CM

## 2022-07-03 DIAGNOSIS — F4310 Post-traumatic stress disorder, unspecified: Secondary | ICD-10-CM

## 2022-07-03 DIAGNOSIS — G4700 Insomnia, unspecified: Secondary | ICD-10-CM

## 2022-07-03 DIAGNOSIS — F411 Generalized anxiety disorder: Secondary | ICD-10-CM

## 2022-07-03 DIAGNOSIS — F514 Sleep terrors [night terrors]: Secondary | ICD-10-CM

## 2022-07-03 MED ORDER — HYDROXYZINE HCL 50 MG PO TABS: ORAL_TABLET | ORAL | 0 refills | Status: DC

## 2022-07-03 MED ORDER — AMPHETAMINE-DEXTROAMPHET ER 30 MG PO CP24: 30.0000 mg | ORAL_CAPSULE | Freq: Every day | ORAL | 0 refills | Status: DC

## 2022-07-03 MED ORDER — AMPHETAMINE-DEXTROAMPHETAMINE 20 MG PO TABS: 20.0000 mg | ORAL_TABLET | Freq: Every day | ORAL | 0 refills | Status: DC

## 2022-07-03 NOTE — Progress Notes (Signed)
[facility]  Patient ID: Debra Osborne,  MRN: [id number]  PCP: Wenda Low, MD  Date of Evaluation: 07/03/2022 time spent:20 minutes  Chief Complaint:  Chief Complaint   Anxiety; ADD; Depression; Follow-up    Virtual Visit via Telehealth  I connected with patient by a video enabled telemedicine application  with their informed consent, and verified patient privacy and that I am speaking with the correct person using two identifiers.  I am private, in my office and the patient is at home.  I discussed the limitations, risks, security and privacy concerns of performing an evaluation and management service by video and the availability of in person appointments. I also discussed with the patient that there may be a patient responsible charge related to this service. The patient expressed understanding and agreed to proceed.   I discussed the assessment and treatment plan with the patient. The patient was provided an opportunity to ask questions and all were answered. The patient agreed with the plan and demonstrated an understanding of the instructions.   The patient was advised to call back or seek an in-person evaluation if the symptoms worsen or if the condition fails to improve as anticipated.  I provided 20  minutes of non-face-to-face time during this encounter.  HISTORY/CURRENT STATUS: HPI For routine med check.  Patient is able to enjoy things.  She is looking forward to auditioning for a dance group.  Even if she does not get it she will still enjoy practicing.  Also makes collages and does other art when she is not studying.  School is going well, she ended up dropping a class several weeks ago because she was taking too many at the time.  Energy and motivation are good.  No extreme sadness, tearfulness, or feelings of hopelessness.   ADLs and personal hygiene are normal.   Appetite has not changed.  Weight is stable.  Denies laxative use, calorie restricting, or  binging and purging.   Denies cutting or any form of self-harm.  Denies suicidal or homicidal thoughts.  Sometimes needs the hydroxyzine for anxiety but not usually.  BuSpar helps to prevent the anxiety to begin with.  Not having panic attacks.  Sleeps well most of the time, uses hydroxyzine for that most every night.  The Prazosin has helped with nightmares.  She feels rested when she gets up at least most of the time.  Adderall is still working.  States that attention is good without easy distractibility.  Able to focus on things and finish tasks to completion.   Patient denies increased energy with decreased need for sleep, increased talkativeness, racing thoughts, impulsivity or risky behaviors, increased spending, increased libido, grandiosity, increased irritability or anger, paranoia, or hallucinations.  Denies dizziness, syncope, seizures, numbness, tingling, tremor, tics, unsteady gait, slurred speech, confusion. Denies muscle or joint pain, stiffness, or dystonia. Denies unexplained weight loss, frequent infections, or sores that heal slowly.  No polyphagia, polydipsia, or polyuria. Denies visual changes or paresthesias.   Individual Medical History/ Review of Systems: Changes? :No    Past medications for mental health diagnoses include: Concerta, Ritalin, Seroquel caused chest tightness at 200 mg or higher, Buspar, Adderall, Hydroxyzine, prazosin  Allergies: Latex and Cephalosporins  Current Medications:  Current Outpatient Medications:    albuterol (VENTOLIN HFA) 108 (90 Base) MCG/ACT inhaler, Inhale 1-2 puffs into the lungs every 4 (four) hours as needed., Disp: , Rfl:    [START ON 09/17/2022] amphetamine-dextroamphetamine (ADDERALL) 20 MG tablet, Take 1 tablet (20 mg  total) by mouth daily with lunch., Disp: 31 tablet, Rfl: 0   [START ON 07/20/2022] amphetamine-dextroamphetamine (ADDERALL) 20 MG tablet, Take 1 tablet (20 mg total) by mouth daily with lunch., Disp: 31 tablet, Rfl:  0   budesonide-formoterol (SYMBICORT) 80-4.5 MCG/ACT inhaler, 2 puffs, Disp: , Rfl:    busPIRone (BUSPAR) 15 MG tablet, Take 1 tablet (15 mg total) by mouth 4 (four) times daily. (Patient taking differently: Take 15 mg by mouth 3 (three) times daily.), Disp: 360 tablet, Rfl: 1   clindamycin (CLINDAGEL) 1 % gel, Apply 1 application topically daily., Disp: , Rfl:    levocetirizine (XYZAL) 5 MG tablet, Take 5 mg by mouth daily., Disp: , Rfl:    norgestimate-ethinyl estradiol (ORTHO-CYCLEN) 0.25-35 MG-MCG tablet, , Disp: , Rfl:    prazosin (MINIPRESS) 1 MG capsule, Take 4 capsules (4 mg total) by mouth at bedtime., Disp: 360 capsule, Rfl: 1   tretinoin (RETIN-A) 0.1 % cream, Apply 1 application topically at bedtime., Disp: , Rfl:    [START ON 07/20/2022] amphetamine-dextroamphetamine (ADDERALL XR) 30 MG 24 hr capsule, Take 1 capsule (30 mg total) by mouth daily., Disp: 31 capsule, Rfl: 0   [START ON 08/18/2022] amphetamine-dextroamphetamine (ADDERALL XR) 30 MG 24 hr capsule, Take 1 capsule (30 mg total) by mouth daily., Disp: 31 capsule, Rfl: 0   [START ON 09/17/2022] amphetamine-dextroamphetamine (ADDERALL XR) 30 MG 24 hr capsule, Take 1 capsule (30 mg total) by mouth daily., Disp: 31 capsule, Rfl: 0   [START ON 08/18/2022] amphetamine-dextroamphetamine (ADDERALL) 20 MG tablet, Take 1 tablet (20 mg total) by mouth daily in the afternoon. prn, Disp: 31 tablet, Rfl: 0   EPINEPHrine 0.3 mg/0.3 mL IJ SOAJ injection, See admin instructions. (Patient not taking: Reported on 09/20/2021), Disp: , Rfl:    hydrOXYzine (ATARAX) 50 MG tablet, TAKE 1-2 TABLETS (50MG -100MG ) BY MOUTH 2 TIMES A DAY AS NEEDED., Disp: 180 tablet, Rfl: 0 Medication Side Effects: none  Family Medical/ Social History: Changes? No  MENTAL HEALTH EXAM:  There were no vitals taken for this visit.There is no height or weight on file to calculate BMI.  General Appearance: Casual, Neat and Well Groomed  Eye Contact:  Good  Speech:  Clear and  Coherent and Normal Rate  Volume:  Normal  Mood:  Euthymic  Affect:  Congruent  Thought Process:  Goal Directed and Descriptions of Associations: Circumstantial  Orientation:  Full (Time, Place, and Person)  Thought Content: Logical   Suicidal Thoughts:  No  Homicidal Thoughts:  No  Memory:  WNL  Judgement:  Good  Insight:  Good  Psychomotor Activity:  Normal  Concentration:  Concentration: Good and Attention Span: Good  Recall:  Good  Fund of Knowledge: Good  Language: Good  Assets:  Communication Skills Desire for Improvement Financial Resources/Insurance Housing Transportation  ADL's:  Intact  Cognition: WNL  Prognosis:  Good   DIAGNOSES:    ICD-10-CM   1. Attention deficit hyperactivity disorder (ADHD), combined type, moderate  F90.2     2. Generalized anxiety disorder  F41.1     3. PTSD (post-traumatic stress disorder)  F43.10     4. Night terrors  F51.4     5. Insomnia, unspecified type  G47.00       Receiving Psychotherapy: Yes    in both DBT and talk therapy  RECOMMENDATIONS:  PDMP reviewed.  Last Adderall XR filled 06/20/2022.  Adderall IR filled 06/20/2022. I provided 20 minutes of non-face-to-face time during this encounter, including time spent before and  after the visit in records review, medical decision making, counseling pertinent to today's visit, and charting.   She is doing well so no changes will be made.  Continue Adderall XR 30 mg, 1 p.o. every morning. Continue Adderall IR 20 mg, 1 p.o. q. Afternoon. Continue BuSpar 15 mg, 1 p.o. 3 times daily.  She can increase to 4 times daily if she is under a lot of stress at school for example with finals or what ever. Continue hydroxyzine 25 mg, 1-2 p.o. 3 times daily as needed anxiety or sleep. Continue prazosin 1 mg, 4 p.o. nightly. Continue therapy. Return in 3-4 months.  Donnal Moat, PA-C

## 2022-08-03 DIAGNOSIS — F4312 Post-traumatic stress disorder, chronic: Secondary | ICD-10-CM | POA: Diagnosis not present

## 2022-08-03 DIAGNOSIS — F410 Panic disorder [episodic paroxysmal anxiety] without agoraphobia: Secondary | ICD-10-CM | POA: Diagnosis not present

## 2022-08-03 DIAGNOSIS — F900 Attention-deficit hyperactivity disorder, predominantly inattentive type: Secondary | ICD-10-CM | POA: Diagnosis not present

## 2022-08-09 DIAGNOSIS — Z23 Encounter for immunization: Secondary | ICD-10-CM | POA: Diagnosis not present

## 2022-08-09 DIAGNOSIS — S060XAA Concussion with loss of consciousness status unknown, initial encounter: Secondary | ICD-10-CM

## 2022-08-09 DIAGNOSIS — S01112A Laceration without foreign body of left eyelid and periocular area, initial encounter: Secondary | ICD-10-CM | POA: Diagnosis not present

## 2022-08-09 HISTORY — DX: Concussion with loss of consciousness status unknown, initial encounter: S06.0XAA

## 2022-08-13 DIAGNOSIS — S060X0A Concussion without loss of consciousness, initial encounter: Secondary | ICD-10-CM | POA: Diagnosis not present

## 2022-08-13 DIAGNOSIS — S0512XA Contusion of eyeball and orbital tissues, left eye, initial encounter: Secondary | ICD-10-CM | POA: Diagnosis not present

## 2022-08-13 DIAGNOSIS — S0592XA Unspecified injury of left eye and orbit, initial encounter: Secondary | ICD-10-CM | POA: Diagnosis not present

## 2022-08-13 DIAGNOSIS — R42 Dizziness and giddiness: Secondary | ICD-10-CM | POA: Diagnosis not present

## 2022-08-16 DIAGNOSIS — R110 Nausea: Secondary | ICD-10-CM | POA: Diagnosis not present

## 2022-08-16 DIAGNOSIS — Z4802 Encounter for removal of sutures: Secondary | ICD-10-CM | POA: Diagnosis not present

## 2022-08-16 DIAGNOSIS — S01112A Laceration without foreign body of left eyelid and periocular area, initial encounter: Secondary | ICD-10-CM | POA: Diagnosis not present

## 2022-08-16 DIAGNOSIS — S060X0D Concussion without loss of consciousness, subsequent encounter: Secondary | ICD-10-CM | POA: Diagnosis not present

## 2022-08-23 DIAGNOSIS — F900 Attention-deficit hyperactivity disorder, predominantly inattentive type: Secondary | ICD-10-CM | POA: Diagnosis not present

## 2022-08-23 DIAGNOSIS — F410 Panic disorder [episodic paroxysmal anxiety] without agoraphobia: Secondary | ICD-10-CM | POA: Diagnosis not present

## 2022-08-23 DIAGNOSIS — F4312 Post-traumatic stress disorder, chronic: Secondary | ICD-10-CM | POA: Diagnosis not present

## 2022-09-03 ENCOUNTER — Other Ambulatory Visit: Payer: Self-pay | Admitting: Physician Assistant

## 2022-09-03 NOTE — Telephone Encounter (Signed)
Patient called in for refill on Prazosin 1mg . Ph: [phone] Appt 7/9

## 2022-09-16 DIAGNOSIS — F900 Attention-deficit hyperactivity disorder, predominantly inattentive type: Secondary | ICD-10-CM | POA: Diagnosis not present

## 2022-09-16 DIAGNOSIS — F410 Panic disorder [episodic paroxysmal anxiety] without agoraphobia: Secondary | ICD-10-CM | POA: Diagnosis not present

## 2022-09-16 DIAGNOSIS — F4312 Post-traumatic stress disorder, chronic: Secondary | ICD-10-CM | POA: Diagnosis not present

## 2022-09-20 DIAGNOSIS — Z23 Encounter for immunization: Secondary | ICD-10-CM | POA: Diagnosis not present

## 2022-09-30 DIAGNOSIS — F410 Panic disorder [episodic paroxysmal anxiety] without agoraphobia: Secondary | ICD-10-CM | POA: Diagnosis not present

## 2022-09-30 DIAGNOSIS — F900 Attention-deficit hyperactivity disorder, predominantly inattentive type: Secondary | ICD-10-CM | POA: Diagnosis not present

## 2022-09-30 DIAGNOSIS — F4312 Post-traumatic stress disorder, chronic: Secondary | ICD-10-CM | POA: Diagnosis not present

## 2022-10-02 ENCOUNTER — Other Ambulatory Visit: Payer: Self-pay | Admitting: Physician Assistant

## 2022-10-02 NOTE — Telephone Encounter (Signed)
Pt said she also needs her buspar refilled as well

## 2022-10-15 ENCOUNTER — Telehealth (INDEPENDENT_AMBULATORY_CARE_PROVIDER_SITE_OTHER): Payer: Self-pay | Admitting: Physician Assistant

## 2022-10-15 ENCOUNTER — Encounter: Payer: Self-pay | Admitting: Physician Assistant

## 2022-10-15 VITALS — BP 120/78 | HR 86

## 2022-10-15 DIAGNOSIS — F4310 Post-traumatic stress disorder, unspecified: Secondary | ICD-10-CM

## 2022-10-15 DIAGNOSIS — F4001 Agoraphobia with panic disorder: Secondary | ICD-10-CM

## 2022-10-15 DIAGNOSIS — F902 Attention-deficit hyperactivity disorder, combined type: Secondary | ICD-10-CM

## 2022-10-15 DIAGNOSIS — G4700 Insomnia, unspecified: Secondary | ICD-10-CM

## 2022-10-15 DIAGNOSIS — F411 Generalized anxiety disorder: Secondary | ICD-10-CM

## 2022-10-15 MED ORDER — HYDROXYZINE HCL 50 MG PO TABS: ORAL_TABLET | ORAL | 1 refills | Status: DC

## 2022-10-15 MED ORDER — BUSPIRONE HCL 15 MG PO TABS: 15.0000 mg | ORAL_TABLET | Freq: Three times a day (TID) | ORAL | 1 refills | Status: DC

## 2022-10-15 MED ORDER — AMPHETAMINE-DEXTROAMPHETAMINE 20 MG PO TABS: 20.0000 mg | ORAL_TABLET | Freq: Every day | ORAL | 0 refills | Status: DC

## 2022-10-15 MED ORDER — AMPHETAMINE-DEXTROAMPHET ER 30 MG PO CP24: 30.0000 mg | ORAL_CAPSULE | Freq: Every day | ORAL | 0 refills | Status: DC

## 2022-10-15 MED ORDER — PRAZOSIN HCL 1 MG PO CAPS: ORAL_CAPSULE | ORAL | 1 refills | Status: DC

## 2022-10-15 NOTE — Progress Notes (Signed)
[facility]  Patient ID: Debra Osborne,  MRN: [id number]  PCP: Georgann Housekeeper, MD  Date of Evaluation: 10/15/2022 time spent:20 minutes  Chief Complaint:  Chief Complaint   Anxiety; Depression; Follow-up    HISTORY/CURRENT STATUS: HPI For routine med check.  Still has weird dreams, not severe like they have been in the past.  No night terrors.  She has not been sleeping well but feels a huge reason for that is her room is not air conditioned so it is hard to sleep.  She does have a couple of fans on her though.  Patient is able to enjoy things.  Energy and motivation are good.  School is going well.  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  No extreme sadness, tearfulness, or feelings of hopelessness.  ADLs and personal hygiene are normal.  Appetite has not changed.  Weight is stable.  Denies laxative use, calorie restricting, or binging and purging.   Denies cutting or any form of self-harm.  Denies suicidal or homicidal thoughts.  States that attention is good without easy distractibility.  Able to focus on things and finish tasks to completion.   Still suffers from anxiety, mostly agoraphobia so she prefers to stay home.  She requests a letter stating this so she may be able to do online classes some of the time.  Patient denies increased energy with decreased need for sleep, increased talkativeness, racing thoughts, impulsivity or risky behaviors, increased spending, increased libido, grandiosity, increased irritability or anger, paranoia, or hallucinations.  Denies dizziness, syncope, seizures, numbness, tingling, tremor, tics, unsteady gait, slurred speech, confusion. Denies muscle or joint pain, stiffness, or dystonia. Denies unexplained weight loss, frequent infections, or sores that heal slowly.  No polyphagia, polydipsia, or polyuria. Denies visual changes or paresthesias.   Individual Medical History/ Review of Systems: Changes? :Yes   had  a conussion, mild, 08/09/2022, car door hit her in the left orbital area, saw neuro, had small laceration sutured.  She is fine now.  Past medications for mental health diagnoses include: Concerta, Ritalin, Seroquel caused chest tightness at 200 mg or higher, Buspar, Adderall, Hydroxyzine, prazosin  Allergies: Allegra [fexofenadine], Latex, and Cephalosporins  Current Medications:  Current Outpatient Medications:    albuterol (VENTOLIN HFA) 108 (90 Base) MCG/ACT inhaler, Inhale 1-2 puffs into the lungs every 4 (four) hours as needed., Disp: , Rfl:    budesonide-formoterol (SYMBICORT) 80-4.5 MCG/ACT inhaler, 2 puffs, Disp: , Rfl:    clindamycin (CLINDAGEL) 1 % gel, Apply 1 application topically daily., Disp: , Rfl:    levocetirizine (XYZAL) 5 MG tablet, Take 5 mg by mouth daily., Disp: , Rfl:    norgestimate-ethinyl estradiol (ORTHO-CYCLEN) 0.25-35 MG-MCG tablet, , Disp: , Rfl:    tretinoin (RETIN-A) 0.1 % cream, Apply 1 application topically at bedtime., Disp: , Rfl:    amphetamine-dextroamphetamine (ADDERALL XR) 30 MG 24 hr capsule, Take 1 capsule (30 mg total) by mouth daily., Disp: 31 capsule, Rfl: 0   [START ON 11/17/2022] amphetamine-dextroamphetamine (ADDERALL XR) 30 MG 24 hr capsule, Take 1 capsule (30 mg total) by mouth daily., Disp: 31 capsule, Rfl: 0   [START ON 12/17/2022] amphetamine-dextroamphetamine (ADDERALL XR) 30 MG 24 hr capsule, Take 1 capsule (30 mg total) by mouth daily., Disp: 31 capsule, Rfl: 0   amphetamine-dextroamphetamine (ADDERALL) 20 MG tablet, Take 1 tablet (20 mg total) by mouth daily with lunch., Disp: 31 tablet, Rfl: 0   [START ON 11/17/2022] amphetamine-dextroamphetamine (ADDERALL) 20 MG tablet, Take  1 tablet (20 mg total) by mouth daily with lunch., Disp: 31 tablet, Rfl: 0   [START ON 12/17/2022] amphetamine-dextroamphetamine (ADDERALL) 20 MG tablet, Take 1 tablet (20 mg total) by mouth daily in the afternoon. prn, Disp: 31 tablet, Rfl: 0   busPIRone (BUSPAR) 15 MG  tablet, Take 1 tablet (15 mg total) by mouth 3 (three) times daily., Disp: 270 tablet, Rfl: 1   EPINEPHrine 0.3 mg/0.3 mL IJ SOAJ injection, See admin instructions. (Patient not taking: Reported on 09/20/2021), Disp: , Rfl:    hydrOXYzine (ATARAX) 50 MG tablet, TAKE 1-2 TABLETS (50-100MG ) BY MOUTH 2  TIMES A DAY AS NEEDED, Disp: 180 tablet, Rfl: 1   prazosin (MINIPRESS) 1 MG capsule, TAKE 4 CAPSULES (4 MG TOTAL) BY MOUTH AT BEDTIME, Disp: 360 capsule, Rfl: 1 Medication Side Effects: none  Family Medical/ Social History: Changes? No  MENTAL HEALTH EXAM:  Blood pressure 120/78, pulse 86.There is no height or weight on file to calculate BMI.  General Appearance: Casual, Neat and Well Groomed  Eye Contact:  Good  Speech:  Clear and Coherent and Normal Rate  Volume:  Normal  Mood:  Euthymic  Affect:  Congruent  Thought Process:  Goal Directed and Descriptions of Associations: Circumstantial  Orientation:  Full (Time, Place, and Person)  Thought Content: Logical   Suicidal Thoughts:  No  Homicidal Thoughts:  No  Memory:  WNL  Judgement:  Good  Insight:  Good  Psychomotor Activity:  Normal  Concentration:  Concentration: Good and Attention Span: Good  Recall:  Good  Fund of Knowledge: Good  Language: Good  Assets:  Communication Skills Desire for Improvement Financial Resources/Insurance Housing Resilience Transportation  ADL's:  Intact  Cognition: WNL  Prognosis:  Good   DIAGNOSES:    ICD-10-CM   1. Agoraphobia with panic attacks  F40.01     2. Attention deficit hyperactivity disorder (ADHD), combined type, moderate  F90.2     3. Generalized anxiety disorder  F41.1     4. PTSD (post-traumatic stress disorder)  F43.10     5. Insomnia, unspecified type  G47.00      Receiving Psychotherapy: Yes    in both DBT and talk therapy  RECOMMENDATIONS:  PDMP reviewed.  Last Adderall XR 09/19/2022.  Adderall IR filled 09/16/2022.   I provided 20 minutes of face to face time during  this encounter, including time spent before and after the visit in records review, medical decision making, counseling pertinent to today's visit, and charting.   She is doing really well overall so no medication changes need to be made. I do agree that with her diagnosis it is best if she not be required to go to class some of the time, as long as she can do her work on line.  A letter will be written stating that she has PTSD, agoraphobia and the impact and functional limitations to academic or residential settings.  Also recommend she needs more flexibility with deadlines for projects, and tests if necessary.  Letter to be sent to her or at [facility] .[address], [address] [place], [place] [address] [phone]  Continue Adderall XR 30 mg, 1 p.o. every morning. Continue Adderall IR 20 mg, 1 p.o. q. Afternoon. Continue BuSpar 15 mg, 1 p.o. 3 times daily.  She can increase to 4 times daily if she is under a lot of stress at school for example with finals or what ever. Continue hydroxyzine 25 mg, 1-2 p.o. 3 times daily as  needed anxiety or sleep. Continue prazosin 1 mg, 4 p.o. nightly. Continue therapy. Return in 6 months.  Melony Overly, PA-C

## 2022-10-17 ENCOUNTER — Encounter: Payer: Self-pay | Admitting: Physician Assistant

## 2022-10-22 DIAGNOSIS — F410 Panic disorder [episodic paroxysmal anxiety] without agoraphobia: Secondary | ICD-10-CM | POA: Diagnosis not present

## 2022-10-22 DIAGNOSIS — F4312 Post-traumatic stress disorder, chronic: Secondary | ICD-10-CM | POA: Diagnosis not present

## 2022-10-22 DIAGNOSIS — F900 Attention-deficit hyperactivity disorder, predominantly inattentive type: Secondary | ICD-10-CM | POA: Diagnosis not present

## 2022-11-07 DIAGNOSIS — F900 Attention-deficit hyperactivity disorder, predominantly inattentive type: Secondary | ICD-10-CM | POA: Diagnosis not present

## 2022-11-07 DIAGNOSIS — F4312 Post-traumatic stress disorder, chronic: Secondary | ICD-10-CM | POA: Diagnosis not present

## 2022-11-07 DIAGNOSIS — F410 Panic disorder [episodic paroxysmal anxiety] without agoraphobia: Secondary | ICD-10-CM | POA: Diagnosis not present

## 2022-11-18 DIAGNOSIS — I890 Lymphedema, not elsewhere classified: Secondary | ICD-10-CM | POA: Diagnosis not present

## 2022-11-18 DIAGNOSIS — F411 Generalized anxiety disorder: Secondary | ICD-10-CM | POA: Diagnosis not present

## 2022-11-18 DIAGNOSIS — R5383 Other fatigue: Secondary | ICD-10-CM | POA: Diagnosis not present

## 2022-11-28 DIAGNOSIS — I890 Lymphedema, not elsewhere classified: Secondary | ICD-10-CM | POA: Diagnosis not present

## 2022-11-29 DIAGNOSIS — F4312 Post-traumatic stress disorder, chronic: Secondary | ICD-10-CM | POA: Diagnosis not present

## 2022-11-29 DIAGNOSIS — F410 Panic disorder [episodic paroxysmal anxiety] without agoraphobia: Secondary | ICD-10-CM | POA: Diagnosis not present

## 2022-11-29 DIAGNOSIS — F900 Attention-deficit hyperactivity disorder, predominantly inattentive type: Secondary | ICD-10-CM | POA: Diagnosis not present

## 2022-12-08 DIAGNOSIS — U071 COVID-19: Secondary | ICD-10-CM | POA: Diagnosis not present

## 2022-12-11 DIAGNOSIS — F900 Attention-deficit hyperactivity disorder, predominantly inattentive type: Secondary | ICD-10-CM | POA: Diagnosis not present

## 2022-12-11 DIAGNOSIS — F4312 Post-traumatic stress disorder, chronic: Secondary | ICD-10-CM | POA: Diagnosis not present

## 2022-12-11 DIAGNOSIS — F410 Panic disorder [episodic paroxysmal anxiety] without agoraphobia: Secondary | ICD-10-CM | POA: Diagnosis not present

## 2022-12-24 DIAGNOSIS — F900 Attention-deficit hyperactivity disorder, predominantly inattentive type: Secondary | ICD-10-CM | POA: Diagnosis not present

## 2022-12-24 DIAGNOSIS — F4312 Post-traumatic stress disorder, chronic: Secondary | ICD-10-CM | POA: Diagnosis not present

## 2022-12-24 DIAGNOSIS — F410 Panic disorder [episodic paroxysmal anxiety] without agoraphobia: Secondary | ICD-10-CM | POA: Diagnosis not present

## 2023-01-14 DIAGNOSIS — F410 Panic disorder [episodic paroxysmal anxiety] without agoraphobia: Secondary | ICD-10-CM | POA: Diagnosis not present

## 2023-01-14 DIAGNOSIS — F900 Attention-deficit hyperactivity disorder, predominantly inattentive type: Secondary | ICD-10-CM | POA: Diagnosis not present

## 2023-01-14 DIAGNOSIS — F4312 Post-traumatic stress disorder, chronic: Secondary | ICD-10-CM | POA: Diagnosis not present

## 2023-01-24 ENCOUNTER — Other Ambulatory Visit: Payer: Self-pay

## 2023-01-24 ENCOUNTER — Telehealth: Payer: Self-pay | Admitting: Physician Assistant

## 2023-01-24 NOTE — Telephone Encounter (Signed)
Pended.

## 2023-01-24 NOTE — Telephone Encounter (Signed)
Pt called at 4:37p requesting refill of Adderall XR 30mg  and Adderall 20mg  to   [facility] [place], [place] - [address], [place] [address] Phone: [phone]  Fax: [phone]   Next appt 1/9

## 2023-01-27 MED ORDER — AMPHETAMINE-DEXTROAMPHETAMINE 20 MG PO TABS: 20.0000 mg | ORAL_TABLET | Freq: Every day | ORAL | 0 refills | Status: DC

## 2023-01-27 MED ORDER — AMPHETAMINE-DEXTROAMPHET ER 30 MG PO CP24: 30.0000 mg | ORAL_CAPSULE | Freq: Every day | ORAL | 0 refills | Status: DC

## 2023-02-04 DIAGNOSIS — F410 Panic disorder [episodic paroxysmal anxiety] without agoraphobia: Secondary | ICD-10-CM | POA: Diagnosis not present

## 2023-02-04 DIAGNOSIS — F900 Attention-deficit hyperactivity disorder, predominantly inattentive type: Secondary | ICD-10-CM | POA: Diagnosis not present

## 2023-02-04 DIAGNOSIS — F4312 Post-traumatic stress disorder, chronic: Secondary | ICD-10-CM | POA: Diagnosis not present

## 2023-02-18 DIAGNOSIS — Z23 Encounter for immunization: Secondary | ICD-10-CM | POA: Diagnosis not present

## 2023-02-25 DIAGNOSIS — F900 Attention-deficit hyperactivity disorder, predominantly inattentive type: Secondary | ICD-10-CM | POA: Diagnosis not present

## 2023-02-25 DIAGNOSIS — F4312 Post-traumatic stress disorder, chronic: Secondary | ICD-10-CM | POA: Diagnosis not present

## 2023-02-25 DIAGNOSIS — F410 Panic disorder [episodic paroxysmal anxiety] without agoraphobia: Secondary | ICD-10-CM | POA: Diagnosis not present

## 2023-02-28 ENCOUNTER — Telehealth: Payer: Self-pay | Admitting: Physician Assistant

## 2023-02-28 ENCOUNTER — Other Ambulatory Visit: Payer: Self-pay

## 2023-02-28 MED ORDER — AMPHETAMINE-DEXTROAMPHETAMINE 20 MG PO TABS: 20.0000 mg | ORAL_TABLET | Freq: Every day | ORAL | 0 refills | Status: DC

## 2023-02-28 MED ORDER — AMPHETAMINE-DEXTROAMPHET ER 30 MG PO CP24: 30.0000 mg | ORAL_CAPSULE | Freq: Every day | ORAL | 0 refills | Status: DC

## 2023-02-28 NOTE — Telephone Encounter (Signed)
Pt rc, she would like both rx's sent in.  I pended adderall 20 mg and adderall xr 30 mg to requested pharmacy.

## 2023-02-28 NOTE — Telephone Encounter (Signed)
Patient called in for refill on Adderall XR 30mg . Ph: [phone] Appt 1/9 Pharmacy [facility] [address] [place], [place]

## 2023-02-28 NOTE — Telephone Encounter (Signed)
Debra Osborne wanted to clarify if she needed both adderall 30 xr and adderall 20 mg- both were lf 10/21

## 2023-03-06 DIAGNOSIS — J329 Chronic sinusitis, unspecified: Secondary | ICD-10-CM | POA: Diagnosis not present

## 2023-03-06 DIAGNOSIS — B9689 Other specified bacterial agents as the cause of diseases classified elsewhere: Secondary | ICD-10-CM | POA: Diagnosis not present

## 2023-03-24 DIAGNOSIS — F900 Attention-deficit hyperactivity disorder, predominantly inattentive type: Secondary | ICD-10-CM | POA: Diagnosis not present

## 2023-03-24 DIAGNOSIS — F410 Panic disorder [episodic paroxysmal anxiety] without agoraphobia: Secondary | ICD-10-CM | POA: Diagnosis not present

## 2023-03-24 DIAGNOSIS — F4312 Post-traumatic stress disorder, chronic: Secondary | ICD-10-CM | POA: Diagnosis not present

## 2023-03-26 ENCOUNTER — Telehealth: Payer: Self-pay | Admitting: Physician Assistant

## 2023-03-26 NOTE — Telephone Encounter (Signed)
LF 11/22 DUE 12/20

## 2023-03-27 NOTE — Telephone Encounter (Signed)
Patient called in for refill on Adderall 20mg  and Adderall 30mg . PH: [phone] Appt 1/9 Pharmacy [facility] [address] [place], [place]

## 2023-03-28 MED ORDER — AMPHETAMINE-DEXTROAMPHETAMINE 20 MG PO TABS: 20.0000 mg | ORAL_TABLET | Freq: Every day | ORAL | 0 refills | Status: DC

## 2023-03-28 NOTE — Telephone Encounter (Signed)
Addressed via pharmacy RF request

## 2023-04-14 DIAGNOSIS — F900 Attention-deficit hyperactivity disorder, predominantly inattentive type: Secondary | ICD-10-CM | POA: Diagnosis not present

## 2023-04-14 DIAGNOSIS — F410 Panic disorder [episodic paroxysmal anxiety] without agoraphobia: Secondary | ICD-10-CM | POA: Diagnosis not present

## 2023-04-14 DIAGNOSIS — F4312 Post-traumatic stress disorder, chronic: Secondary | ICD-10-CM | POA: Diagnosis not present

## 2023-04-15 ENCOUNTER — Other Ambulatory Visit: Payer: Self-pay | Admitting: Physician Assistant

## 2023-04-16 NOTE — Telephone Encounter (Signed)
 Has appt. tomorrow

## 2023-04-17 ENCOUNTER — Encounter: Payer: Self-pay | Admitting: Physician Assistant

## 2023-04-17 ENCOUNTER — Telehealth (INDEPENDENT_AMBULATORY_CARE_PROVIDER_SITE_OTHER): Payer: Self-pay | Admitting: Physician Assistant

## 2023-04-17 DIAGNOSIS — F902 Attention-deficit hyperactivity disorder, combined type: Secondary | ICD-10-CM

## 2023-04-17 DIAGNOSIS — F514 Sleep terrors [night terrors]: Secondary | ICD-10-CM

## 2023-04-17 DIAGNOSIS — F411 Generalized anxiety disorder: Secondary | ICD-10-CM

## 2023-04-17 DIAGNOSIS — F4001 Agoraphobia with panic disorder: Secondary | ICD-10-CM

## 2023-04-17 DIAGNOSIS — F4310 Post-traumatic stress disorder, unspecified: Secondary | ICD-10-CM

## 2023-04-17 MED ORDER — AMPHETAMINE-DEXTROAMPHETAMINE 20 MG PO TABS: 20.0000 mg | ORAL_TABLET | Freq: Every day | ORAL | 0 refills | Status: DC

## 2023-04-17 MED ORDER — AMPHETAMINE-DEXTROAMPHET ER 30 MG PO CP24: 30.0000 mg | ORAL_CAPSULE | Freq: Every day | ORAL | 0 refills | Status: DC

## 2023-04-17 MED ORDER — AMPHETAMINE-DEXTROAMPHET ER 30 MG PO CP24: 30.0000 mg | ORAL_CAPSULE | Freq: Every day | ORAL | 0 refills | Status: AC

## 2023-04-17 MED ORDER — BUSPIRONE HCL 15 MG PO TABS: 15.0000 mg | ORAL_TABLET | Freq: Three times a day (TID) | ORAL | 1 refills | Status: DC

## 2023-04-17 MED ORDER — HYDROXYZINE HCL 50 MG PO TABS: ORAL_TABLET | ORAL | 1 refills | Status: DC

## 2023-04-17 MED ORDER — PRAZOSIN HCL 1 MG PO CAPS: ORAL_CAPSULE | ORAL | 1 refills | Status: DC

## 2023-04-17 NOTE — Progress Notes (Signed)
 [facility]  Patient ID: Debra Osborne,  MRN: [id number]  PCP: Ransom Other, MD  Date of Evaluation: 04/17/2023 time spent:20 minutes  Chief Complaint: ADHD, anxiety follow-up  I connected with patient by a video enabled telemedicine application  with their informed consent, and verified patient privacy and that I am speaking with the correct person using two identifiers.  I am private, in my office and the patient is at home.  I discussed the limitations, risks, security and privacy concerns of performing an evaluation and management service by video and the availability of in person appointments. I also discussed with the patient that there may be a patient responsible charge related to this service. The patient expressed understanding and agreed to proceed.   I discussed the assessment and treatment plan with the patient. The patient was provided an opportunity to ask questions and all were answered. The patient agreed with the plan and demonstrated an understanding of the instructions.   The patient was advised to call back or seek an in-person evaluation if the symptoms worsen or if the condition fails to improve as anticipated.  I provided 20  minutes of non-face-to-face time during this encounter.  HISTORY/CURRENT STATUS: HPI For routine med check.  Meds are working well. States that attention is good without easy distractibility.  Able to focus on things and finish tasks to completion.  When she gets a certain generic brand of the Adderall XR she has to take the instant release more often.  Some generic brands do not cause that though.  Patient is able to enjoy things.  Energy and motivation are good.  School is going well.  No extreme sadness, tearfulness, or feelings of hopelessness.  Sleeps well, no nightmares.  ADLs and personal hygiene are normal.  Appetite has not changed.  Weight is stable.  Denies laxative use, calorie restricting, or binging and purging.    Denies cutting or any form of self-harm.  Anxiety is well controlled with the BuSpar .  Denies suicidal or homicidal thoughts.  Patient denies increased energy with decreased need for sleep, increased talkativeness, racing thoughts, impulsivity or risky behaviors, increased spending, increased libido, grandiosity, increased irritability or anger, paranoia, or hallucinations.  Denies dizziness, syncope, seizures, numbness, tingling, tremor, tics, unsteady gait, slurred speech, confusion. Denies muscle or joint pain, stiffness, or dystonia. Denies unexplained weight loss, frequent infections, or sores that heal slowly.  No polyphagia, polydipsia, or polyuria. Denies visual changes or paresthesias.   Individual Medical History/ Review of Systems: Changes? :No     Past medications for mental health diagnoses include: Concerta , Ritalin , Seroquel  caused chest tightness at 200 mg or higher, Buspar , Adderall, Hydroxyzine , prazosin   Allergies: Allegra [fexofenadine], Latex, and Cephalosporins  Current Medications:  Current Outpatient Medications:    albuterol  (VENTOLIN  HFA) 108 (90 Base) MCG/ACT inhaler, Inhale 1-2 puffs into the lungs every 4 (four) hours as needed., Disp: , Rfl:    amphetamine -dextroamphetamine  (ADDERALL XR) 30 MG 24 hr capsule, Take 1 capsule (30 mg total) by mouth daily., Disp: 31 capsule, Rfl: 0   budesonide-formoterol (SYMBICORT) 80-4.5 MCG/ACT inhaler, 2 puffs, Disp: , Rfl:    clindamycin (CLINDAGEL) 1 % gel, Apply 1 application topically daily., Disp: , Rfl:    levocetirizine (XYZAL) 5 MG tablet, Take 5 mg by mouth daily., Disp: , Rfl:    norgestimate-ethinyl estradiol (ORTHO-CYCLEN) 0.25-35 MG-MCG tablet, , Disp: , Rfl:    tretinoin (RETIN-A) 0.1 % cream, Apply 1 application topically at bedtime., Disp: , Rfl:    [  START ON 04/30/2023] amphetamine -dextroamphetamine  (ADDERALL XR) 30 MG 24 hr capsule, Take 1 capsule (30 mg total) by mouth daily., Disp: 30 capsule, Rfl: 0   [START  ON 05/30/2023] amphetamine -dextroamphetamine  (ADDERALL XR) 30 MG 24 hr capsule, Take 1 capsule (30 mg total) by mouth daily., Disp: 30 capsule, Rfl: 0   [START ON 06/26/2023] amphetamine -dextroamphetamine  (ADDERALL XR) 30 MG 24 hr capsule, Take 1 capsule (30 mg total) by mouth daily., Disp: 31 capsule, Rfl: 0   [START ON 04/30/2023] amphetamine -dextroamphetamine  (ADDERALL) 20 MG tablet, Take 1 tablet (20 mg total) by mouth daily with lunch., Disp: 30 tablet, Rfl: 0   [START ON 05/30/2023] amphetamine -dextroamphetamine  (ADDERALL) 20 MG tablet, Take 1 tablet (20 mg total) by mouth daily with lunch., Disp: 31 tablet, Rfl: 0   [START ON 06/26/2023] amphetamine -dextroamphetamine  (ADDERALL) 20 MG tablet, Take 1 tablet (20 mg total) by mouth daily in the afternoon. prn, Disp: 30 tablet, Rfl: 0   busPIRone  (BUSPAR ) 15 MG tablet, Take 1 tablet (15 mg total) by mouth 3 (three) times daily., Disp: 270 tablet, Rfl: 1   EPINEPHrine 0.3 mg/0.3 mL IJ SOAJ injection, See admin instructions. (Patient not taking: Reported on 04/17/2023), Disp: , Rfl:    hydrOXYzine  (ATARAX ) 50 MG tablet, TAKE 1-2 TABLETS (50-100MG ) BY MOUTH 2  TIMES A DAY AS NEEDED, Disp: 180 tablet, Rfl: 1   prazosin  (MINIPRESS ) 1 MG capsule, TAKE 4 CAPSULES (4 MG TOTAL) BY MOUTH AT BEDTIME, Disp: 360 capsule, Rfl: 1 Medication Side Effects: none  Family Medical/ Social History: Changes? No  MENTAL HEALTH EXAM:  There were no vitals taken for this visit.There is no height or weight on file to calculate BMI.  General Appearance: Casual, Neat and Well Groomed  Eye Contact:  Good  Speech:  Clear and Coherent and Normal Rate  Volume:  Normal  Mood:  Euthymic  Affect:  Congruent  Thought Process:  Goal Directed and Descriptions of Associations: Circumstantial  Orientation:  Full (Time, Place, and Person)  Thought Content: Logical   Suicidal Thoughts:  No  Homicidal Thoughts:  No  Memory:  WNL  Judgement:  Good  Insight:  Good  Psychomotor Activity:   Normal  Concentration:  Concentration: Good and Attention Span: Good  Recall:  Good  Fund of Knowledge: Good  Language: Good  Assets:  Communication Skills Desire for Improvement Financial Resources/Insurance Housing Physical Health Resilience Transportation  ADL's:  Intact  Cognition: WNL  Prognosis:  Good   DIAGNOSES:    ICD-10-CM   1. Attention deficit hyperactivity disorder (ADHD), combined type, moderate  F90.2     2. Agoraphobia with panic attacks  F40.01     3. Generalized anxiety disorder  F41.1     4. PTSD (post-traumatic stress disorder)  F43.10     5. Night terrors  F51.4       Receiving Psychotherapy: Yes    in both DBT and talk therapy  RECOMMENDATIONS:  PDMP reviewed.  Last Adderall 03/31/2023. I provided 20 minutes of non-face-to-face time during this encounter, including time spent before and after the visit in records review, medical decision making, counseling pertinent to today's visit, and charting.   She is doing well with her medications no changes need to be made.  Continue Adderall XR 30 mg, 1 p.o. every morning. Continue Adderall IR 20 mg, 1 p.o. q. Afternoon. Continue BuSpar  15 mg, 1 p.o. 3 times daily.  She can increase to 4 times daily if she is under a lot of stress at school  for example with finals or what ever. Continue hydroxyzine  25 mg, 1-2 p.o. 3 times daily as needed anxiety or sleep. Continue prazosin  1 mg, 4 p.o. nightly. Continue therapy. Return in 6 months.  Verneita Cooks, PA-C

## 2023-04-28 DIAGNOSIS — F900 Attention-deficit hyperactivity disorder, predominantly inattentive type: Secondary | ICD-10-CM | POA: Diagnosis not present

## 2023-04-28 DIAGNOSIS — F410 Panic disorder [episodic paroxysmal anxiety] without agoraphobia: Secondary | ICD-10-CM | POA: Diagnosis not present

## 2023-04-28 DIAGNOSIS — F4312 Post-traumatic stress disorder, chronic: Secondary | ICD-10-CM | POA: Diagnosis not present

## 2023-05-05 DIAGNOSIS — Z13220 Encounter for screening for lipoid disorders: Secondary | ICD-10-CM | POA: Diagnosis not present

## 2023-05-05 DIAGNOSIS — Z0000 Encounter for general adult medical examination without abnormal findings: Secondary | ICD-10-CM | POA: Diagnosis not present

## 2023-05-05 DIAGNOSIS — I951 Orthostatic hypotension: Secondary | ICD-10-CM | POA: Diagnosis not present

## 2023-05-05 DIAGNOSIS — F909 Attention-deficit hyperactivity disorder, unspecified type: Secondary | ICD-10-CM | POA: Diagnosis not present

## 2023-05-12 DIAGNOSIS — F410 Panic disorder [episodic paroxysmal anxiety] without agoraphobia: Secondary | ICD-10-CM | POA: Diagnosis not present

## 2023-05-12 DIAGNOSIS — F4312 Post-traumatic stress disorder, chronic: Secondary | ICD-10-CM | POA: Diagnosis not present

## 2023-05-12 DIAGNOSIS — F900 Attention-deficit hyperactivity disorder, predominantly inattentive type: Secondary | ICD-10-CM | POA: Diagnosis not present

## 2023-05-19 DIAGNOSIS — L700 Acne vulgaris: Secondary | ICD-10-CM | POA: Diagnosis not present

## 2023-05-26 DIAGNOSIS — F410 Panic disorder [episodic paroxysmal anxiety] without agoraphobia: Secondary | ICD-10-CM | POA: Diagnosis not present

## 2023-05-26 DIAGNOSIS — F900 Attention-deficit hyperactivity disorder, predominantly inattentive type: Secondary | ICD-10-CM | POA: Diagnosis not present

## 2023-05-26 DIAGNOSIS — F4312 Post-traumatic stress disorder, chronic: Secondary | ICD-10-CM | POA: Diagnosis not present

## 2023-06-09 ENCOUNTER — Other Ambulatory Visit (HOSPITAL_COMMUNITY)
Admission: RE | Admit: 2023-06-09 | Discharge: 2023-06-09 | Disposition: A | Discharge status: Home / Self Care | Source: Ambulatory Visit | Attending: Nurse Practitioner | Admitting: Nurse Practitioner

## 2023-06-09 DIAGNOSIS — Z124 Encounter for screening for malignant neoplasm of cervix: Secondary | ICD-10-CM | POA: Insufficient documentation

## 2023-06-09 DIAGNOSIS — Z01419 Encounter for gynecological examination (general) (routine) without abnormal findings: Secondary | ICD-10-CM | POA: Diagnosis not present

## 2023-06-10 DIAGNOSIS — J069 Acute upper respiratory infection, unspecified: Secondary | ICD-10-CM | POA: Diagnosis not present

## 2023-06-10 DIAGNOSIS — Z1152 Encounter for screening for COVID-19: Secondary | ICD-10-CM | POA: Diagnosis not present

## 2023-06-12 LAB — CYTOLOGY - PAP: Diagnosis: NEGATIVE

## 2023-06-19 DIAGNOSIS — F4312 Post-traumatic stress disorder, chronic: Secondary | ICD-10-CM | POA: Diagnosis not present

## 2023-06-19 DIAGNOSIS — F410 Panic disorder [episodic paroxysmal anxiety] without agoraphobia: Secondary | ICD-10-CM | POA: Diagnosis not present

## 2023-06-19 DIAGNOSIS — F900 Attention-deficit hyperactivity disorder, predominantly inattentive type: Secondary | ICD-10-CM | POA: Diagnosis not present

## 2023-07-07 DIAGNOSIS — F4312 Post-traumatic stress disorder, chronic: Secondary | ICD-10-CM | POA: Diagnosis not present

## 2023-07-07 DIAGNOSIS — F900 Attention-deficit hyperactivity disorder, predominantly inattentive type: Secondary | ICD-10-CM | POA: Diagnosis not present

## 2023-07-07 DIAGNOSIS — F410 Panic disorder [episodic paroxysmal anxiety] without agoraphobia: Secondary | ICD-10-CM | POA: Diagnosis not present

## 2023-07-25 DIAGNOSIS — F4312 Post-traumatic stress disorder, chronic: Secondary | ICD-10-CM | POA: Diagnosis not present

## 2023-07-25 DIAGNOSIS — F410 Panic disorder [episodic paroxysmal anxiety] without agoraphobia: Secondary | ICD-10-CM | POA: Diagnosis not present

## 2023-07-25 DIAGNOSIS — F900 Attention-deficit hyperactivity disorder, predominantly inattentive type: Secondary | ICD-10-CM | POA: Diagnosis not present

## 2023-07-29 ENCOUNTER — Other Ambulatory Visit: Payer: Self-pay | Admitting: Physician Assistant

## 2023-07-30 ENCOUNTER — Other Ambulatory Visit: Payer: Self-pay

## 2023-08-04 DIAGNOSIS — F410 Panic disorder [episodic paroxysmal anxiety] without agoraphobia: Secondary | ICD-10-CM | POA: Diagnosis not present

## 2023-08-04 DIAGNOSIS — F900 Attention-deficit hyperactivity disorder, predominantly inattentive type: Secondary | ICD-10-CM | POA: Diagnosis not present

## 2023-08-04 DIAGNOSIS — F4312 Post-traumatic stress disorder, chronic: Secondary | ICD-10-CM | POA: Diagnosis not present

## 2023-08-10 DIAGNOSIS — J45901 Unspecified asthma with (acute) exacerbation: Secondary | ICD-10-CM | POA: Diagnosis not present

## 2023-08-10 DIAGNOSIS — J329 Chronic sinusitis, unspecified: Secondary | ICD-10-CM | POA: Diagnosis not present

## 2023-08-28 ENCOUNTER — Other Ambulatory Visit: Payer: Self-pay | Admitting: Physician Assistant

## 2023-09-15 DIAGNOSIS — I890 Lymphedema, not elsewhere classified: Secondary | ICD-10-CM | POA: Diagnosis not present

## 2023-10-02 ENCOUNTER — Other Ambulatory Visit: Payer: Self-pay | Admitting: Physician Assistant

## 2023-10-13 ENCOUNTER — Telehealth (INDEPENDENT_AMBULATORY_CARE_PROVIDER_SITE_OTHER): Payer: Self-pay | Admitting: Physician Assistant

## 2023-10-13 ENCOUNTER — Encounter: Payer: Self-pay | Admitting: Physician Assistant

## 2023-10-13 DIAGNOSIS — F514 Sleep terrors [night terrors]: Secondary | ICD-10-CM

## 2023-10-13 DIAGNOSIS — F4310 Post-traumatic stress disorder, unspecified: Secondary | ICD-10-CM

## 2023-10-13 DIAGNOSIS — F902 Attention-deficit hyperactivity disorder, combined type: Secondary | ICD-10-CM

## 2023-10-13 DIAGNOSIS — F411 Generalized anxiety disorder: Secondary | ICD-10-CM

## 2023-10-13 DIAGNOSIS — G4700 Insomnia, unspecified: Secondary | ICD-10-CM

## 2023-10-13 MED ORDER — AMPHETAMINE-DEXTROAMPHETAMINE 10 MG PO TABS: 5.0000 mg | ORAL_TABLET | Freq: Every day | ORAL | 0 refills | Status: DC

## 2023-10-13 MED ORDER — AMPHETAMINE-DEXTROAMPHET ER 30 MG PO CP24: 30.0000 mg | ORAL_CAPSULE | Freq: Every day | ORAL | 0 refills | Status: DC

## 2023-10-13 MED ORDER — TRAZODONE HCL 50 MG PO TABS: 25.0000 mg | ORAL_TABLET | Freq: Every evening | ORAL | 1 refills | Status: DC | PRN

## 2023-10-13 MED ORDER — BUSPIRONE HCL 15 MG PO TABS: 15.0000 mg | ORAL_TABLET | Freq: Three times a day (TID) | ORAL | 1 refills | Status: DC

## 2023-10-13 NOTE — Progress Notes (Signed)
 [facility]  Patient ID: Debra Osborne,  MRN: [id number]  PCP: Ransom Other, MD  Date of Evaluation: 10/13/2023 Time spent:20 minutes  Chief Complaint: ADHD, anxiety follow-up  I connected with patient by a video enabled telemedicine application  with their informed consent, and verified patient privacy and that I am speaking with the correct person using two identifiers.  I am private, in my office and the patient is at home.  I discussed the limitations, risks, security and privacy concerns of performing an evaluation and management service by video and the availability of in person appointments. I also discussed with the patient that there may be a patient responsible charge related to this service. The patient expressed understanding and agreed to proceed.   I discussed the assessment and treatment plan with the patient. The patient was provided an opportunity to ask questions and all were answered. The patient agreed with the plan and demonstrated an understanding of the instructions.   The patient was advised to call back or seek an in-person evaluation if the symptoms worsen or if the condition fails to improve as anticipated.  I provided 20  minutes of non-face-to-face time during this encounter.  HISTORY/CURRENT STATUS: HPI For routine med check.  Doing well except for not sleeping well.  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 Late games and unable to go to sleep and stay asleep. Nightmares are controlled.   Patient is able to enjoy things.  Energy and motivation are good.  School is doing well.   No extreme sadness, tearfulness, or feelings of hopelessness.  ADLs and personal hygiene are normal.   Appetite has not changed.  Weight is stable.  No mania, delirium, or psychosis.  Denies suicidal or homicidal thoughts.  States that attention is good without easy distractibility.  Able to focus on things and finish tasks to  completion.   Denies dizziness, syncope, seizures, numbness, tingling, tremor, tics, unsteady gait, slurred speech, confusion. Denies muscle or joint pain, stiffness, or dystonia.  Individual Medical History/ Review of Systems: Changes? :No     Past medications for mental health diagnoses include: Concerta , Ritalin , Seroquel  caused chest tightness at 200 mg or higher, Buspar , Adderall, Hydroxyzine , prazosin , Lunesta caused memory probs and felt foggy,  [place] caused 'weird nightmares'  Allergies: Allegra [fexofenadine], Latex, and Cephalosporins  Current Medications:  Current Outpatient Medications:    albuterol  (VENTOLIN  HFA) 108 (90 Base) MCG/ACT inhaler, Inhale 1-2 puffs into the lungs every 4 (four) hours as needed., Disp: , Rfl:    amphetamine -dextroamphetamine  (ADDERALL XR) 30 MG 24 hr capsule, Take 1 capsule (30 mg total) by mouth daily., Disp: 30 capsule, Rfl: 0   amphetamine -dextroamphetamine  (ADDERALL) 10 MG tablet, Take 0.5-2 tablets (5-20 mg total) by mouth daily with breakfast., Disp: 60 tablet, Rfl: 0   amphetamine -dextroamphetamine  (ADDERALL) 20 MG tablet, Take 1 tablet (20 mg total) by mouth daily with lunch., Disp: 30 tablet, Rfl: 0   amphetamine -dextroamphetamine  (ADDERALL) 20 MG tablet, Take 1 tablet (20 mg total) by mouth daily with lunch., Disp: 31 tablet, Rfl: 0   amphetamine -dextroamphetamine  (ADDERALL) 20 MG tablet, Take 1 tablet (20 mg total) by mouth daily in the afternoon. prn, Disp: 30 tablet, Rfl: 0   budesonide-formoterol (SYMBICORT) 80-4.5 MCG/ACT inhaler, 2 puffs, Disp: , Rfl:    clindamycin (CLINDAGEL) 1 % gel, Apply 1 application topically daily., Disp: , Rfl:    hydrOXYzine  (ATARAX ) 50 MG tablet, TAKE 1 TO 2 TABLETS (50-100mg )  BY MOUTH TWICE DAILY AS NEEDED, Disp: 180 tablet, Rfl: 1   levocetirizine (XYZAL) 5 MG tablet, Take 5 mg by mouth daily., Disp: , Rfl:    norgestimate-ethinyl estradiol (ORTHO-CYCLEN) 0.25-35 MG-MCG tablet, , Disp: , Rfl:    prazosin   (MINIPRESS ) 1 MG capsule, TAKE 4 CAPSULES (4 MG TOTAL) BY MOUTH AT BEDTIME, Disp: 360 capsule, Rfl: 1   traZODone  (DESYREL ) 50 MG tablet, Take 0.5-2 tablets (25-100 mg total) by mouth at bedtime as needed for sleep., Disp: 60 tablet, Rfl: 1   tretinoin (RETIN-A) 0.1 % cream, Apply 1 application topically at bedtime., Disp: , Rfl:    TURMERIC CURCUMIN PO, Take by mouth., Disp: , Rfl:    VITAMIN D PO, Take by mouth., Disp: , Rfl:    [START ON 10/31/2023] amphetamine -dextroamphetamine  (ADDERALL XR) 30 MG 24 hr capsule, Take 1 capsule (30 mg total) by mouth daily., Disp: 31 capsule, Rfl: 0   [START ON 11/30/2023] amphetamine -dextroamphetamine  (ADDERALL XR) 30 MG 24 hr capsule, Take 1 capsule (30 mg total) by mouth daily., Disp: 31 capsule, Rfl: 0   [START ON 12/30/2023] amphetamine -dextroamphetamine  (ADDERALL XR) 30 MG 24 hr capsule, Take 1 capsule (30 mg total) by mouth daily., Disp: 31 capsule, Rfl: 0   busPIRone  (BUSPAR ) 15 MG tablet, Take 1 tablet (15 mg total) by mouth 3 (three) times daily., Disp: 270 tablet, Rfl: 1   EPINEPHrine 0.3 mg/0.3 mL IJ SOAJ injection, See admin instructions. (Patient not taking: Reported on 10/13/2023), Disp: , Rfl:  Medication Side Effects: none  Family Medical/ Social History: Changes? No  MENTAL HEALTH EXAM:  There were no vitals taken for this visit.There is no height or weight on file to calculate BMI.  General Appearance: Casual, Neat and Well Groomed  Eye Contact:  Good  Speech:  Clear and Coherent and Normal Rate  Volume:  Normal  Mood:  Euthymic  Affect:  Congruent  Thought Process:  Goal Directed and Descriptions of Associations: Circumstantial  Orientation:  Full (Time, Place, and Person)  Thought Content: Logical   Suicidal Thoughts:  No  Homicidal Thoughts:  No  Memory:  WNL  Judgement:  Good  Insight:  Good  Psychomotor Activity:  Normal  Concentration:  Concentration: Good and Attention Span: Good  Recall:  Good  Fund of Knowledge: Good   Language: Good  Assets:  Communication Skills Desire for Improvement Financial Resources/Insurance Housing Physical Health Resilience Transportation  ADL's:  Intact  Cognition: WNL  Prognosis:  Good   DIAGNOSES:    ICD-10-CM   1. Insomnia, unspecified type  G47.00     2. PTSD (post-traumatic stress disorder)  F43.10     3. Generalized anxiety disorder  F41.1     4. Attention deficit hyperactivity disorder (ADHD), combined type, moderate  F90.2     5. Night terrors  F51.4       Receiving Psychotherapy: Yes    in both DBT and talk therapy  RECOMMENDATIONS:  PDMP reviewed.  Last Adderall 10/02/2023. I provided approximately 20 minutes of non-face-to-face time during this encounter, including time spent before and after the visit in records review, medical decision making, counseling pertinent to today's visit, and charting.   Discussed sleep hygiene. Recommend Trazodone . Benefits, risks and SE were discussed and she accepts.   No other changes are needed.   Continue Adderall XR 30 mg, 1 p.o. every morning. Continue Adderall IR 20 mg, 1 p.o. q. Afternoon. Continue BuSpar  15 mg, 1 p.o. 3 times daily.  She can increase  to 4 times daily if she is under a lot of stress at school for example with finals or what ever. Continue hydroxyzine  25 mg, 1-2 p.o. 3 times daily as needed anxiety or sleep. Continue prazosin  1 mg, 4 p.o. nightly. Start Trazodone  50 mg, 1/2-2 at bedtime prn sleep.  Continue therapy. Return in 6-8 wks.  Verneita Cooks, PA-C

## 2023-10-19 ENCOUNTER — Encounter: Payer: Self-pay | Admitting: Physician Assistant

## 2023-10-20 DIAGNOSIS — F4312 Post-traumatic stress disorder, chronic: Secondary | ICD-10-CM | POA: Diagnosis not present

## 2023-10-27 ENCOUNTER — Other Ambulatory Visit: Payer: Self-pay | Admitting: Physician Assistant

## 2023-11-13 DIAGNOSIS — F4312 Post-traumatic stress disorder, chronic: Secondary | ICD-10-CM | POA: Diagnosis not present

## 2023-11-21 ENCOUNTER — Ambulatory Visit: Payer: Self-pay | Admitting: Physician Assistant

## 2023-12-04 DIAGNOSIS — F4312 Post-traumatic stress disorder, chronic: Secondary | ICD-10-CM | POA: Diagnosis not present

## 2023-12-18 ENCOUNTER — Telehealth (INDEPENDENT_AMBULATORY_CARE_PROVIDER_SITE_OTHER): Payer: Self-pay | Admitting: Physician Assistant

## 2023-12-18 ENCOUNTER — Encounter: Payer: Self-pay | Admitting: Physician Assistant

## 2023-12-18 DIAGNOSIS — F411 Generalized anxiety disorder: Secondary | ICD-10-CM | POA: Diagnosis not present

## 2023-12-18 DIAGNOSIS — F4310 Post-traumatic stress disorder, unspecified: Secondary | ICD-10-CM

## 2023-12-18 DIAGNOSIS — G4700 Insomnia, unspecified: Secondary | ICD-10-CM | POA: Diagnosis not present

## 2023-12-18 DIAGNOSIS — F902 Attention-deficit hyperactivity disorder, combined type: Secondary | ICD-10-CM

## 2023-12-18 MED ORDER — AMPHETAMINE-DEXTROAMPHETAMINE 20 MG PO TABS: 20.0000 mg | ORAL_TABLET | Freq: Every day | ORAL | 0 refills | Status: DC

## 2023-12-18 MED ORDER — AMPHETAMINE-DEXTROAMPHET ER 30 MG PO CP24: 30.0000 mg | ORAL_CAPSULE | Freq: Every day | ORAL | 0 refills | Status: DC

## 2023-12-18 MED ORDER — TRAZODONE HCL 50 MG PO TABS: 25.0000 mg | ORAL_TABLET | Freq: Every evening | ORAL | 1 refills | Status: AC | PRN

## 2023-12-18 NOTE — Progress Notes (Signed)
 [facility]  Patient ID: Debra Osborne,  MRN: [id number]  PCP: Ransom Other, MD  Date of Evaluation: 12/18/2023 Time spent:20 minutes  Chief Complaint: ADHD, anxiety follow-up  I connected with patient by a video enabled telemedicine application  with their informed consent, and verified patient privacy and that I am speaking with the correct person using two identifiers.  I am private, in my office and the patient is at home.  I discussed the limitations, risks, security and privacy concerns of performing an evaluation and management service by video and the availability of in person appointments. I also discussed with the patient that there may be a patient responsible charge related to this service. The patient expressed understanding and agreed to proceed.   I discussed the assessment and treatment plan with the patient. The patient was provided an opportunity to ask questions and all were answered. The patient agreed with the plan and demonstrated an understanding of the instructions.   The patient was advised to call back or seek an in-person evaluation if the symptoms worsen or if the condition fails to improve as anticipated.  I provided 20  minutes of non-face-to-face time during this encounter.  HISTORY/CURRENT STATUS: HPI For routine med check.  We added Trazodone  2 months ago. It's helped quite a bit.  Has weird dreams sometimes, but tolerable. Not having nightmares. Prazosin  has helped that a lot.   Adderall is still effective. States that attention is good without easy distractibility.  Able to focus on things and finish tasks to completion.   Patient is able to enjoy things.  Energy and motivation are good.  School is going well.  She is at [employer].  No extreme sadness, tearfulness, or feelings of hopelessness. ADLs and personal hygiene are normal.   She sometimes gets overwhelmed but no panic attacks.  It is situational.  Appetite has not changed.  Weight  is stable.  No reports of laxative use, calorie restricting, or binging and purging.   Denies cutting or any form of self-harm.  No mania, delirium, AH/VH.  No SI/HI.  Individual Medical History/ Review of Systems: Changes? :No     Past medications for mental health diagnoses include: Concerta , Ritalin , Seroquel  caused chest tightness at 200 mg or higher, Buspar , Adderall, Hydroxyzine , prazosin , Lunesta caused memory probs and felt foggy,  Davigo caused 'weird nightmares'  Allergies: Allegra [fexofenadine], Latex, and Cephalosporins  Current Medications:  Current Outpatient Medications:    albuterol  (VENTOLIN  HFA) 108 (90 Base) MCG/ACT inhaler, Inhale 1-2 puffs into the lungs every 4 (four) hours as needed., Disp: , Rfl:    amphetamine -dextroamphetamine  (ADDERALL XR) 30 MG 24 hr capsule, Take 1 capsule (30 mg total) by mouth daily., Disp: 30 capsule, Rfl: 0   budesonide-formoterol (SYMBICORT) 80-4.5 MCG/ACT inhaler, 2 puffs, Disp: , Rfl:    busPIRone  (BUSPAR ) 15 MG tablet, Take 1 tablet (15 mg total) by mouth 3 (three) times daily., Disp: 270 tablet, Rfl: 1   clindamycin (CLINDAGEL) 1 % gel, Apply 1 application topically daily., Disp: , Rfl:    hydrOXYzine  (ATARAX ) 50 MG tablet, TAKE 1 TO 2 TABLETS (50-100mg ) BY MOUTH TWICE DAILY AS NEEDED, Disp: 180 tablet, Rfl: 1   levocetirizine (XYZAL) 5 MG tablet, Take 5 mg by mouth daily., Disp: , Rfl:    norgestimate-ethinyl estradiol (ORTHO-CYCLEN) 0.25-35 MG-MCG tablet, , Disp: , Rfl:    prazosin  (MINIPRESS ) 1 MG capsule, TAKE FOUR CAPSULES (4mg ) BY MOUTH daily AT BEDTIME, Disp: 360 capsule, Rfl: 1  tretinoin (RETIN-A) 0.1 % cream, Apply 1 application topically at bedtime., Disp: , Rfl:    amphetamine -dextroamphetamine  (ADDERALL XR) 30 MG 24 hr capsule, Take 1 capsule (30 mg total) by mouth daily., Disp: 31 capsule, Rfl: 0   [START ON 01/29/2024] amphetamine -dextroamphetamine  (ADDERALL XR) 30 MG 24 hr capsule, Take 1 capsule (30 mg total) by mouth  daily., Disp: 31 capsule, Rfl: 0   [START ON 02/28/2024] amphetamine -dextroamphetamine  (ADDERALL XR) 30 MG 24 hr capsule, Take 1 capsule (30 mg total) by mouth daily., Disp: 31 capsule, Rfl: 0   amphetamine -dextroamphetamine  (ADDERALL) 20 MG tablet, Take 1 tablet (20 mg total) by mouth daily with lunch., Disp: 31 tablet, Rfl: 0   [START ON 01/29/2024] amphetamine -dextroamphetamine  (ADDERALL) 20 MG tablet, Take 1 tablet (20 mg total) by mouth daily with lunch., Disp: 31 tablet, Rfl: 0   [START ON 02/28/2024] amphetamine -dextroamphetamine  (ADDERALL) 20 MG tablet, Take 1 tablet (20 mg total) by mouth daily in the afternoon. prn, Disp: 31 tablet, Rfl: 0   EPINEPHrine 0.3 mg/0.3 mL IJ SOAJ injection, See admin instructions. (Patient not taking: Reported on 12/18/2023), Disp: , Rfl:    traZODone  (DESYREL ) 50 MG tablet, Take 0.5-2 tablets (25-100 mg total) by mouth at bedtime as needed for sleep., Disp: 180 tablet, Rfl: 1   TURMERIC CURCUMIN PO, Take by mouth., Disp: , Rfl:    VITAMIN D PO, Take by mouth., Disp: , Rfl:  Medication Side Effects: none  Family Medical/ Social History: Changes? New job at [employer] History  MENTAL HEALTH EXAM:  There were no vitals taken for this visit.There is no height or weight on file to calculate BMI.  General Appearance: Casual, Neat and Well Groomed  Eye Contact:  Good  Speech:  Clear and Coherent and Normal Rate  Volume:  Normal  Mood:  Euthymic  Affect:  Congruent  Thought Process:  Goal Directed and Descriptions of Associations: Circumstantial  Orientation:  Full (Time, Place, and Person)  Thought Content: Logical   Suicidal Thoughts:  No  Homicidal Thoughts:  No  Memory:  WNL  Judgement:  Good  Insight:  Good  Psychomotor Activity:  Normal  Concentration:  Concentration: Good and Attention Span: Good  Recall:  Good  Fund of Knowledge: Good  Language: Good  Assets:  Communication Skills Desire for Improvement Financial  Resources/Insurance Housing Physical Health Resilience Transportation Vocational/Educational  ADL's:  Intact  Cognition: WNL  Prognosis:  Good   DIAGNOSES:    ICD-10-CM   1. Generalized anxiety disorder  F41.1     2. Attention deficit hyperactivity disorder (ADHD), combined type, moderate  F90.2     3. PTSD (post-traumatic stress disorder)  F43.10     4. Insomnia, unspecified type  G47.00       Receiving Psychotherapy: Yes    in both DBT and talk therapy  RECOMMENDATIONS:  PDMP reviewed.  Last Adderall 12/01/2023. I provided approximately 20 minutes of non-face-to-face time during this encounter, including time spent before and after the visit in records review, medical decision making, counseling pertinent to today's visit, and charting.   Arleene is doing well so no changes need to be made.  Continue Adderall XR 30 mg, 1 p.o. every morning. Continue Adderall IR 20 mg, 1 p.o. q. Afternoon. Continue BuSpar  15 mg, 1 p.o. 3 times daily.  She can increase to 4 times daily if she is under a lot of stress at school for example with finals or what ever. Continue hydroxyzine  25 mg, 1-2 p.o. 3  times daily as needed anxiety or sleep. Continue prazosin  1 mg, 4 p.o. nightly. Continue trazodone  50 mg, 1/2-2 at bedtime prn sleep.  Continue therapy. Return in  4 months.    Verneita Cooks, PA-C

## 2023-12-23 DIAGNOSIS — F4312 Post-traumatic stress disorder, chronic: Secondary | ICD-10-CM | POA: Diagnosis not present

## 2024-01-05 ENCOUNTER — Encounter: Payer: Self-pay | Admitting: Physician Assistant

## 2024-01-14 DIAGNOSIS — F4312 Post-traumatic stress disorder, chronic: Secondary | ICD-10-CM | POA: Diagnosis not present

## 2024-02-02 DIAGNOSIS — F410 Panic disorder [episodic paroxysmal anxiety] without agoraphobia: Secondary | ICD-10-CM | POA: Diagnosis not present

## 2024-02-02 DIAGNOSIS — F4312 Post-traumatic stress disorder, chronic: Secondary | ICD-10-CM | POA: Diagnosis not present

## 2024-02-02 DIAGNOSIS — F900 Attention-deficit hyperactivity disorder, predominantly inattentive type: Secondary | ICD-10-CM | POA: Diagnosis not present

## 2024-02-03 ENCOUNTER — Other Ambulatory Visit: Payer: Self-pay | Admitting: Physician Assistant

## 2024-02-23 DIAGNOSIS — F900 Attention-deficit hyperactivity disorder, predominantly inattentive type: Secondary | ICD-10-CM | POA: Diagnosis not present

## 2024-02-23 DIAGNOSIS — F410 Panic disorder [episodic paroxysmal anxiety] without agoraphobia: Secondary | ICD-10-CM | POA: Diagnosis not present

## 2024-02-23 DIAGNOSIS — F4312 Post-traumatic stress disorder, chronic: Secondary | ICD-10-CM | POA: Diagnosis not present

## 2024-03-29 ENCOUNTER — Other Ambulatory Visit: Payer: Self-pay | Admitting: Physician Assistant

## 2024-04-07 ENCOUNTER — Telehealth: Payer: Self-pay | Admitting: Physician Assistant

## 2024-04-07 MED ORDER — HYDROXYZINE HCL 50 MG PO TABS: ORAL_TABLET | ORAL | 0 refills | Status: AC

## 2024-04-07 NOTE — Telephone Encounter (Signed)
 [facility] school pharm is closed. Req Hydroxyzine  sent to [facility] at [address] [place]

## 2024-04-07 NOTE — Telephone Encounter (Signed)
 Sent!

## 2024-04-19 ENCOUNTER — Encounter: Payer: Self-pay | Admitting: Physician Assistant

## 2024-04-19 ENCOUNTER — Telehealth: Payer: Self-pay | Admitting: Physician Assistant

## 2024-04-19 DIAGNOSIS — F902 Attention-deficit hyperactivity disorder, combined type: Secondary | ICD-10-CM | POA: Diagnosis not present

## 2024-04-19 DIAGNOSIS — F4310 Post-traumatic stress disorder, unspecified: Secondary | ICD-10-CM | POA: Diagnosis not present

## 2024-04-19 DIAGNOSIS — G4700 Insomnia, unspecified: Secondary | ICD-10-CM | POA: Diagnosis not present

## 2024-04-19 DIAGNOSIS — F514 Sleep terrors [night terrors]: Secondary | ICD-10-CM

## 2024-04-19 DIAGNOSIS — F411 Generalized anxiety disorder: Secondary | ICD-10-CM | POA: Diagnosis not present

## 2024-04-19 MED ORDER — AMPHETAMINE-DEXTROAMPHET ER 30 MG PO CP24: 30.0000 mg | ORAL_CAPSULE | Freq: Every day | ORAL | 0 refills | Status: AC

## 2024-04-19 MED ORDER — AMPHETAMINE-DEXTROAMPHETAMINE 10 MG PO TABS: 10.0000 mg | ORAL_TABLET | Freq: Every day | ORAL | 0 refills | Status: AC

## 2024-04-19 MED ORDER — BUSPIRONE HCL 15 MG PO TABS: 15.0000 mg | ORAL_TABLET | Freq: Three times a day (TID) | ORAL | 1 refills | Status: AC

## 2024-04-19 MED ORDER — PRAZOSIN HCL 1 MG PO CAPS: ORAL_CAPSULE | ORAL | 1 refills | Status: AC

## 2024-04-19 NOTE — Progress Notes (Signed)
 "     [facility]  Patient ID: Debra Osborne,  MRN: [id number]  PCP: Ransom Other, MD  Date of Evaluation: 04/19/2024 Time spent:20 minutes  Chief Complaint: ADHD, anxiety follow-up  I connected with patient by a video enabled telemedicine application  with their informed consent, and verified patient privacy and that I am speaking with the correct person using two identifiers.  I am private, in my office and the patient is at home.  I discussed the limitations, risks, security and privacy concerns of performing an evaluation and management service by video and the availability of in person appointments. I also discussed with the patient that there may be a patient responsible charge related to this service. The patient expressed understanding and agreed to proceed.   I discussed the assessment and treatment plan with the patient. The patient was provided an opportunity to ask questions and all were answered. The patient agreed with the plan and demonstrated an understanding of the instructions.   The patient was advised to call back or seek an in-person evaluation if the symptoms worsen or if the condition fails to improve as anticipated.  I provided 20  minutes of non-face-to-face time during this encounter.  HISTORY/CURRENT STATUS: HPI For routine med check.  Debra Osborne is doing well.  Feels like all her meds are working as intended.  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  She is also involved with the dance group.  She has 1 more year at [employer].  Grades are good.  No symptoms of depression.  Appetite is normal and weight is stable.  ADLs and personal hygiene are normal.  She sleeps well, sometimes needs the trazodone  but not always.  It is effective when needed.  Not complaining of nightmares.  The prazosin  has been very helpful.  No complaints of anxiety.States that attention is good without easy distractibility.  Able to focus on  things and finish tasks to completion.  No delirium, mania, psychosis, suicidal or homicidal thoughts.  Individual Medical History/ Review of Systems: Changes? :Yes  had all 4 wisdom teeth removed 03/30/2024.     Past medications for mental health diagnoses include: Concerta , Ritalin , Seroquel  caused chest tightness at 200 mg or higher, Buspar , Adderall, Hydroxyzine , prazosin , Lunesta caused memory probs and felt foggy,  Davigo caused 'weird nightmares'  Allergies: Allegra [fexofenadine], Latex, and Cephalosporins  Current Medications:  Current Outpatient Medications:    albuterol  (VENTOLIN  HFA) 108 (90 Base) MCG/ACT inhaler, Inhale 1-2 puffs into the lungs every 4 (four) hours as needed., Disp: , Rfl:    amphetamine -dextroamphetamine  (ADDERALL XR) 30 MG 24 hr capsule, Take 1 capsule (30 mg total) by mouth daily., Disp: 30 capsule, Rfl: 0   [START ON 06/23/2024] amphetamine -dextroamphetamine  (ADDERALL) 10 MG tablet, Take 1 tablet (10 mg total) by mouth daily in the afternoon., Disp: 31 tablet, Rfl: 0   [START ON 05/27/2024] amphetamine -dextroamphetamine  (ADDERALL) 10 MG tablet, Take 1 tablet (10 mg total) by mouth daily in the afternoon., Disp: 31 tablet, Rfl: 0   [START ON 04/27/2024] amphetamine -dextroamphetamine  (ADDERALL) 10 MG tablet, Take 1 tablet (10 mg total) by mouth daily in the afternoon., Disp: 31 tablet, Rfl: 0   budesonide-formoterol (SYMBICORT) 80-4.5 MCG/ACT inhaler, 2 puffs, Disp: , Rfl:    clindamycin (CLINDAGEL) 1 % gel, Apply 1 application topically daily., Disp: , Rfl:    hydrOXYzine  (ATARAX ) 50 MG tablet, TAKE 1 TO 2 TABLETS (50-100mg  total) BY MOUTH 3  times DAILY AS NEEDED, Disp: 270 tablet, Rfl: 0   levocetirizine (XYZAL) 5 MG tablet, Take 5 mg by mouth daily., Disp: , Rfl:    norgestimate-ethinyl estradiol (ORTHO-CYCLEN) 0.25-35 MG-MCG tablet, , Disp: , Rfl:    traZODone  (DESYREL ) 50 MG tablet, Take 0.5-2 tablets (25-100 mg total) by mouth at bedtime as needed for sleep.,  Disp: 180 tablet, Rfl: 1   tretinoin (RETIN-A) 0.1 % cream, Apply 1 application topically at bedtime., Disp: , Rfl:    VITAMIN D PO, Take by mouth., Disp: , Rfl:    [START ON 04/27/2024] amphetamine -dextroamphetamine  (ADDERALL XR) 30 MG 24 hr capsule, Take 1 capsule (30 mg total) by mouth daily., Disp: 31 capsule, Rfl: 0   [START ON 05/27/2024] amphetamine -dextroamphetamine  (ADDERALL XR) 30 MG 24 hr capsule, Take 1 capsule (30 mg total) by mouth daily., Disp: 31 capsule, Rfl: 0   [START ON 06/23/2024] amphetamine -dextroamphetamine  (ADDERALL XR) 30 MG 24 hr capsule, Take 1 capsule (30 mg total) by mouth daily., Disp: 31 capsule, Rfl: 0   busPIRone  (BUSPAR ) 15 MG tablet, Take 1 tablet (15 mg total) by mouth 3 (three) times daily., Disp: 270 tablet, Rfl: 1   EPINEPHrine 0.3 mg/0.3 mL IJ SOAJ injection, See admin instructions. (Patient not taking: Reported on 04/19/2024), Disp: , Rfl:    prazosin  (MINIPRESS ) 1 MG capsule, TAKE FOUR CAPSULES (4mg ) BY MOUTH daily AT BEDTIME, Disp: 360 capsule, Rfl: 1   TURMERIC CURCUMIN PO, Take by mouth. (Patient not taking: Reported on 04/19/2024), Disp: , Rfl:  Medication Side Effects: none  Family Medical/ Social History: Changes?  See HPI  MENTAL HEALTH EXAM:  There were no vitals taken for this visit.There is no height or weight on file to calculate BMI.  General Appearance: Casual and Well Groomed  Eye Contact:  Good  Speech:  Clear and Coherent and Normal Rate  Volume:  Normal  Mood:  Euthymic  Affect:  Congruent  Thought Process:  Goal Directed and Descriptions of Associations: Circumstantial  Orientation:  Full (Time, Place, and Person)  Thought Content: Logical   Suicidal Thoughts:  No  Homicidal Thoughts:  No  Memory:  WNL  Judgement:  Good  Insight:  Good  Psychomotor Activity:  Normal  Concentration:  Concentration: Good and Attention Span: Good  Recall:  Good  Fund of Knowledge: Good  Language: Good  Assets:  Communication Skills Desire for  Improvement Financial Resources/Insurance Housing Physical Health Resilience Social Support Transportation Vocational/Educational  ADL's:  Intact  Cognition: WNL  Prognosis:  Good   DIAGNOSES:    ICD-10-CM   1. Attention deficit hyperactivity disorder (ADHD), combined type, moderate  F90.2     2. Generalized anxiety disorder  F41.1     3. Insomnia, unspecified type  G47.00     4. Night terrors  F51.4     5. PTSD (post-traumatic stress disorder)  F43.10       Receiving Psychotherapy: Yes       RECOMMENDATIONS:  PDMP reviewed.  Last Adderall 03/29/2024.  Oxycodone filled 03/30/2024. I provided approximately 20  minutes of non-face-to-face time during this encounter, including time spent before and after the visit in records review, medical decision making, counseling pertinent to today's visit, and charting.   Debra Osborne is doing very well so no changes need be made.  Continue Adderall XR 30 mg, 1 p.o. every morning. Continue Adderall IR 10 mg, 1 p.o. q. Afternoon.prn Continue BuSpar  15 mg, 1 p.o. 3 times daily.  She can increase to 4 times  daily if she is under a lot of stress at school for example with finals or what ever. Continue hydroxyzine  25 mg, 1-2 p.o. 3 times daily as needed anxiety or sleep. Continue prazosin  1 mg, 4 p.o. nightly. Continue trazodone  50 mg, 1/2-2 at bedtime prn sleep.  Continue therapy. Return in  3-4 months.    Verneita Cooks, PA-C  "
